# Patient Record
Sex: Female | Born: 1945 | Race: White | Hispanic: No | Smoking: Current every day smoker
Health system: Southern US, Community
[De-identification: ages and names within clinical notes are randomized; demographics above are authoritative.]

## PROBLEM LIST (undated history)

## (undated) ENCOUNTER — Ambulatory Visit: Payer: MEDICARE | Attending: Hematology & Oncology | Primary: Hematology & Oncology

## (undated) ENCOUNTER — Telehealth: Attending: Hematology & Oncology | Primary: Hematology & Oncology

## (undated) ENCOUNTER — Ambulatory Visit: Payer: MEDICARE

## (undated) ENCOUNTER — Ambulatory Visit

## (undated) ENCOUNTER — Ambulatory Visit: Attending: Hematology & Oncology | Primary: Hematology & Oncology

## (undated) ENCOUNTER — Encounter

## (undated) ENCOUNTER — Telehealth

## (undated) ENCOUNTER — Encounter: Attending: Hematology & Oncology | Primary: Hematology & Oncology

## (undated) DIAGNOSIS — C801 Malignant (primary) neoplasm, unspecified: Secondary | ICD-10-CM

---

## 2018-01-10 ENCOUNTER — Emergency Department: Payer: Medicare HMO

## 2018-01-10 ENCOUNTER — Inpatient Hospital Stay: Payer: Medicare HMO

## 2018-01-10 ENCOUNTER — Inpatient Hospital Stay
Admission: EM | Admit: 2018-01-10 | Discharge: 2018-01-14 | DRG: 480 | Disposition: A | Discharge status: Skilled Nursing Facility | Payer: Medicare HMO | Attending: Internal Medicine | Admitting: Internal Medicine

## 2018-01-10 ENCOUNTER — Other Ambulatory Visit: Payer: Self-pay

## 2018-01-10 DIAGNOSIS — M899 Disorder of bone, unspecified: Secondary | ICD-10-CM | POA: Diagnosis not present

## 2018-01-10 DIAGNOSIS — J181 Lobar pneumonia, unspecified organism: Secondary | ICD-10-CM | POA: Diagnosis present

## 2018-01-10 DIAGNOSIS — Z7189 Other specified counseling: Secondary | ICD-10-CM

## 2018-01-10 DIAGNOSIS — J189 Pneumonia, unspecified organism: Secondary | ICD-10-CM | POA: Diagnosis not present

## 2018-01-10 DIAGNOSIS — Z515 Encounter for palliative care: Secondary | ICD-10-CM | POA: Diagnosis not present

## 2018-01-10 DIAGNOSIS — W19XXXA Unspecified fall, initial encounter: Secondary | ICD-10-CM

## 2018-01-10 DIAGNOSIS — R54 Age-related physical debility: Secondary | ICD-10-CM | POA: Diagnosis present

## 2018-01-10 DIAGNOSIS — R55 Syncope and collapse: Secondary | ICD-10-CM

## 2018-01-10 DIAGNOSIS — I10 Essential (primary) hypertension: Secondary | ICD-10-CM | POA: Diagnosis present

## 2018-01-10 DIAGNOSIS — F17210 Nicotine dependence, cigarettes, uncomplicated: Secondary | ICD-10-CM | POA: Diagnosis present

## 2018-01-10 DIAGNOSIS — C50911 Malignant neoplasm of unspecified site of right female breast: Secondary | ICD-10-CM | POA: Diagnosis present

## 2018-01-10 DIAGNOSIS — W1830XA Fall on same level, unspecified, initial encounter: Secondary | ICD-10-CM | POA: Diagnosis present

## 2018-01-10 DIAGNOSIS — Z66 Do not resuscitate: Secondary | ICD-10-CM | POA: Diagnosis present

## 2018-01-10 DIAGNOSIS — F17200 Nicotine dependence, unspecified, uncomplicated: Secondary | ICD-10-CM | POA: Diagnosis present

## 2018-01-10 DIAGNOSIS — E876 Hypokalemia: Secondary | ICD-10-CM | POA: Diagnosis present

## 2018-01-10 DIAGNOSIS — S72012A Unspecified intracapsular fracture of left femur, initial encounter for closed fracture: Principal | ICD-10-CM | POA: Diagnosis present

## 2018-01-10 DIAGNOSIS — S72002A Fracture of unspecified part of neck of left femur, initial encounter for closed fracture: Secondary | ICD-10-CM | POA: Diagnosis present

## 2018-01-10 DIAGNOSIS — R509 Fever, unspecified: Secondary | ICD-10-CM

## 2018-01-10 DIAGNOSIS — Z91011 Allergy to milk products: Secondary | ICD-10-CM | POA: Diagnosis not present

## 2018-01-10 DIAGNOSIS — T148XXA Other injury of unspecified body region, initial encounter: Secondary | ICD-10-CM

## 2018-01-10 DIAGNOSIS — J440 Chronic obstructive pulmonary disease with acute lower respiratory infection: Secondary | ICD-10-CM | POA: Diagnosis present

## 2018-01-10 DIAGNOSIS — Y92239 Unspecified place in hospital as the place of occurrence of the external cause: Secondary | ICD-10-CM | POA: Diagnosis present

## 2018-01-10 DIAGNOSIS — Z681 Body mass index (BMI) 19 or less, adult: Secondary | ICD-10-CM

## 2018-01-10 DIAGNOSIS — Z9011 Acquired absence of right breast and nipple: Secondary | ICD-10-CM | POA: Diagnosis not present

## 2018-01-10 DIAGNOSIS — S72009A Fracture of unspecified part of neck of unspecified femur, initial encounter for closed fracture: Secondary | ICD-10-CM | POA: Diagnosis present

## 2018-01-10 DIAGNOSIS — E43 Unspecified severe protein-calorie malnutrition: Secondary | ICD-10-CM | POA: Diagnosis present

## 2018-01-10 DIAGNOSIS — M810 Age-related osteoporosis without current pathological fracture: Secondary | ICD-10-CM | POA: Diagnosis present

## 2018-01-10 DIAGNOSIS — S72002D Fracture of unspecified part of neck of left femur, subsequent encounter for closed fracture with routine healing: Secondary | ICD-10-CM | POA: Diagnosis not present

## 2018-01-10 DIAGNOSIS — N390 Urinary tract infection, site not specified: Secondary | ICD-10-CM | POA: Diagnosis present

## 2018-01-10 HISTORY — DX: Malignant (primary) neoplasm, unspecified: C80.1

## 2018-01-10 LAB — CBC WITH DIFFERENTIAL/PLATELET
Abs Immature Granulocytes: 0.02 10*3/uL (ref 0.00–0.07)
Basophils Absolute: 0 10*3/uL (ref 0.0–0.1)
Basophils Relative: 1 %
Eosinophils Absolute: 0.1 10*3/uL (ref 0.0–0.5)
Eosinophils Relative: 2 %
HCT: 40.8 % (ref 36.0–46.0)
Hemoglobin: 13.8 g/dL (ref 12.0–15.0)
Immature Granulocytes: 0 %
Lymphocytes Relative: 39 %
Lymphs Abs: 2.9 10*3/uL (ref 0.7–4.0)
MCH: 32.2 pg (ref 26.0–34.0)
MCHC: 33.8 g/dL (ref 30.0–36.0)
MCV: 95.3 fL (ref 80.0–100.0)
Monocytes Absolute: 0.5 10*3/uL (ref 0.1–1.0)
Monocytes Relative: 7 %
Neutro Abs: 3.8 10*3/uL (ref 1.7–7.7)
Neutrophils Relative %: 51 %
Platelets: 308 10*3/uL (ref 150–400)
RBC: 4.28 MIL/uL (ref 3.87–5.11)
RDW: 12.6 % (ref 11.5–15.5)
WBC: 7.4 10*3/uL (ref 4.0–10.5)
nRBC: 0 % (ref 0.0–0.2)

## 2018-01-10 LAB — BASIC METABOLIC PANEL
Anion gap: 11 (ref 5–15)
BUN: 14 mg/dL (ref 8–23)
CO2: 26 mmol/L (ref 22–32)
Calcium: 9 mg/dL (ref 8.9–10.3)
Chloride: 104 mmol/L (ref 98–111)
Creatinine, Ser: 0.62 mg/dL (ref 0.44–1.00)
GFR calc Af Amer: >60 mL/min (ref >60)
GFR calc non Af Amer: >60 mL/min (ref >60)
Glucose, Bld: 131 mg/dL — ABNORMAL HIGH (ref 70–99)
Potassium: 3 mmol/L — ABNORMAL LOW (ref 3.5–5.1)
Sodium: 141 mmol/L (ref 135–145)

## 2018-01-10 LAB — TROPONIN I: Troponin I: <0.03 ng/mL (ref <0.03)

## 2018-01-10 LAB — TSH: TSH: 3.676 u[IU]/mL (ref 0.350–4.500)

## 2018-01-10 LAB — PROTIME-INR
INR: 0.97
Prothrombin Time: 12.8 seconds (ref 11.4–15.2)

## 2018-01-10 MED ORDER — ACETAMINOPHEN 325 MG PO TABS
650.0000 mg | ORAL_TABLET | Freq: Four times a day (QID) | ORAL | Status: DC | PRN
  Administered 2018-01-12 – 2018-01-14 (×4): 650 mg via ORAL
  Filled 2018-01-10 (×4): qty 2

## 2018-01-10 MED ORDER — NICOTINE 14 MG/24HR TD PT24
14.0000 mg | MEDICATED_PATCH | Freq: Every day | TRANSDERMAL | Status: DC
  Administered 2018-01-10 – 2018-01-14 (×5): 14 mg via TRANSDERMAL
  Filled 2018-01-10 (×5): qty 1

## 2018-01-10 MED ORDER — HYDROCODONE-ACETAMINOPHEN 5-325 MG PO TABS
1.0000 | ORAL_TABLET | ORAL | Status: DC | PRN
  Administered 2018-01-11 (×2): 2 via ORAL
  Administered 2018-01-13 (×2): 1 via ORAL
  Filled 2018-01-10 (×2): qty 1
  Filled 2018-01-10: qty 2
  Filled 2018-01-10: qty 1
  Filled 2018-01-10: qty 2

## 2018-01-10 MED ORDER — CEFAZOLIN SODIUM-DEXTROSE 1-4 GM/50ML-% IV SOLN
1.0000 g | INTRAVENOUS | Status: AC
  Filled 2018-01-10: qty 50

## 2018-01-10 MED ORDER — ACETAMINOPHEN 650 MG RE SUPP: 650.0000 mg | Freq: Four times a day (QID) | RECTAL | Status: DC | PRN

## 2018-01-10 MED ORDER — ONDANSETRON HCL 4 MG PO TABS: 4.0000 mg | ORAL_TABLET | Freq: Four times a day (QID) | ORAL | Status: DC | PRN

## 2018-01-10 MED ORDER — NICOTINE 14 MG/24HR TD PT24: 14.0000 mg | MEDICATED_PATCH | Freq: Every day | TRANSDERMAL | Status: DC

## 2018-01-10 MED ORDER — CLINDAMYCIN PHOSPHATE 600 MG/50ML IV SOLN
600.0000 mg | INTRAVENOUS | Status: DC
  Filled 2018-01-10: qty 50

## 2018-01-10 MED ORDER — HEPARIN SODIUM (PORCINE) 5000 UNIT/ML IJ SOLN
5000.0000 [IU] | Freq: Three times a day (TID) | INTRAMUSCULAR | Status: DC
  Administered 2018-01-10 – 2018-01-12 (×4): 5000 [IU] via SUBCUTANEOUS
  Filled 2018-01-10 (×4): qty 1

## 2018-01-10 MED ORDER — MORPHINE SULFATE (PF) 4 MG/ML IV SOLN
4.0000 mg | Freq: Once | INTRAVENOUS | Status: AC
  Administered 2018-01-10: 4 mg via INTRAVENOUS
  Filled 2018-01-10: qty 1

## 2018-01-10 MED ORDER — DOCUSATE SODIUM 100 MG PO CAPS
100.0000 mg | ORAL_CAPSULE | Freq: Two times a day (BID) | ORAL | Status: DC
  Filled 2018-01-10 (×4): qty 1

## 2018-01-10 MED ORDER — HYDRALAZINE HCL 20 MG/ML IJ SOLN: 10.0000 mg | INTRAMUSCULAR | Status: DC | PRN

## 2018-01-10 MED ORDER — POTASSIUM CHLORIDE CRYS ER 20 MEQ PO TBCR
20.0000 meq | EXTENDED_RELEASE_TABLET | Freq: Once | ORAL | Status: AC
  Administered 2018-01-10: 20 meq via ORAL
  Filled 2018-01-10: qty 1

## 2018-01-10 MED ORDER — HYDROCODONE-ACETAMINOPHEN 5-325 MG PO TABS
1.0000 | ORAL_TABLET | ORAL | Status: DC | PRN
  Administered 2018-01-10: 1 via ORAL
  Filled 2018-01-10: qty 1

## 2018-01-10 MED ORDER — SODIUM CHLORIDE 0.9 % IV SOLN
INTRAVENOUS | Status: DC
  Administered 2018-01-10 – 2018-01-12 (×3): via INTRAVENOUS

## 2018-01-10 MED ORDER — MORPHINE SULFATE (PF) 2 MG/ML IV SOLN: 1.0000 mg | INTRAVENOUS | Status: DC | PRN

## 2018-01-10 MED ORDER — POLYETHYLENE GLYCOL 3350 17 G PO PACK
17.0000 g | PACK | Freq: Every day | ORAL | Status: DC | PRN
  Administered 2018-01-13: 17 g via ORAL
  Filled 2018-01-10: qty 1

## 2018-01-10 MED ORDER — ONDANSETRON HCL 4 MG/2ML IJ SOLN: 4.0000 mg | Freq: Four times a day (QID) | INTRAMUSCULAR | Status: DC | PRN

## 2018-01-10 MED ORDER — METOPROLOL TARTRATE 25 MG PO TABS
25.0000 mg | ORAL_TABLET | Freq: Two times a day (BID) | ORAL | Status: DC
  Administered 2018-01-11 – 2018-01-14 (×7): 25 mg via ORAL
  Filled 2018-01-10 (×7): qty 1

## 2018-01-10 MED ORDER — MORPHINE SULFATE (PF) 2 MG/ML IV SOLN
2.0000 mg | INTRAVENOUS | Status: DC | PRN
  Administered 2018-01-10: 2 mg via INTRAVENOUS
  Filled 2018-01-10: qty 1

## 2018-01-10 MED ORDER — POTASSIUM CHLORIDE 20 MEQ PO PACK
50.0000 meq | PACK | Freq: Once | ORAL | Status: AC
  Administered 2018-01-10: 50 meq via ORAL
  Filled 2018-01-10: qty 3

## 2018-01-10 MED ORDER — ONDANSETRON HCL 4 MG/2ML IJ SOLN
4.0000 mg | Freq: Once | INTRAMUSCULAR | Status: AC
  Administered 2018-01-10: 4 mg via INTRAVENOUS
  Filled 2018-01-10: qty 2

## 2018-01-10 MED ORDER — ADULT MULTIVITAMIN W/MINERALS CH
1.0000 | ORAL_TABLET | Freq: Every day | ORAL | Status: DC
  Administered 2018-01-10 – 2018-01-13 (×2): 1 via ORAL
  Filled 2018-01-10 (×4): qty 1

## 2018-01-10 MED ORDER — BISACODYL 5 MG PO TBEC
5.0000 mg | DELAYED_RELEASE_TABLET | Freq: Every day | ORAL | Status: DC | PRN
  Administered 2018-01-13: 5 mg via ORAL
  Filled 2018-01-10: qty 1

## 2018-01-10 MED ORDER — CHLORHEXIDINE GLUCONATE 4 % EX LIQD: 1.0000 "application " | Freq: Once | CUTANEOUS | Status: DC

## 2018-01-10 NOTE — ED Notes (Signed)
Patient transported to X-ray 

## 2018-01-10 NOTE — H&P (Signed)
[facility] NAME: Janet [name] Janet [name] [name]    MR#:  [id number]  DATE OF BIRTH:  Jul 09, 1945  DATE OF ADMISSION:  01/10/2018  PRIMARY CARE PHYSICIAN: [facility], [place]   REQUESTING/REFERRING PHYSICIAN:   CHIEF COMPLAINT:   Chief Complaint  Janet [name] [name] presents with  . Fall    HISTORY OF PRESENT ILLNESS: Janet [name] Janet [name] [name]  is a 72 y.o. female with a known history of untreated breast cancer and tobacco smoking presents to Janet [name] emergency room status post dizzy event resulting in near passing out, falling to Janet [name] floor, developed left hip pain, in Janet [name] emergency room Janet [name] [name] was found to have a femoral neck fracture, potassium 3, blood pressure elevated, Janet [name] [name] resting comfortably in bed wishes to be made DNR going forward, Janet [name] [name] now be admitted for acute left femoral neck fracture and untreated breast cancer.   PAST MEDICAL HISTORY:   Past Medical History:  Diagnosis Date  . Cancer ([place])     PAST SURGICAL HISTORY: None  SOCIAL HISTORY:  Social History   Tobacco Use  . Smoking status: Current Every Day Smoker    Packs/day: 0.50    Types: Cigarettes  . Smokeless tobacco: Never Used  Substance Use Topics  . Alcohol use: Not Currently    FAMILY HISTORY:  HTN  DRUG ALLERGIES:  Allergies  Allergen Reactions  . Lactose Intolerance (Gi) Other (See Comments)    REVIEW OF SYSTEMS:   CONSTITUTIONAL: No fever, fatigue or weakness.  EYES: No blurred or double vision.  EARS, NOSE, AND THROAT: No tinnitus or ear pain.  RESPIRATORY: No cough, shortness of breath, wheezing or hemoptysis.  CARDIOVASCULAR: No chest pain, orthopnea, edema.  GASTROINTESTINAL: No nausea, vomiting, diarrhea or abdominal pain.  GENITOURINARY: No dysuria, hematuria.  ENDOCRINE: No polyuria, nocturia,  HEMATOLOGY: No anemia, easy bruising or bleeding SKIN: No rash or lesion. MUSCULOSKELETAL: Left hip pain    NEUROLOGIC: No tingling, numbness, weakness.   PSYCHIATRY: No anxiety or depression.   MEDICATIONS AT HOME:  Prior to Admission medications   Not on File      PHYSICAL EXAMINATION:   VITAL SIGNS: Blood pressure (!) 173/70, pulse 81, temperature 98.1 F (36.7 C), temperature source Oral, resp. rate 18, height 5\' 1"  (1.549 m), weight 45.4 kg, SpO2 93 %.  GENERAL:  72 y.o.-year-old Janet [name] [name] lying in Janet [name] bed with no acute distress.  Cachectic appearing EYES: Pupils equal, round, reactive to light and accommodation. No scleral icterus. Extraocular muscles intact.  HEENT: Head atraumatic, normocephalic. Oropharynx and nasopharynx clear.  NECK:  Supple, no jugular venous distention. No thyroid enlargement, no tenderness.  LUNGS: Normal breath sounds bilaterally, no wheezing, rales,rhonchi or crepitation. No use of accessory muscles of respiration.  CARDIOVASCULAR: S1, S2 normal. No murmurs, rubs, or gallops.  ABDOMEN: Soft, nontender, nondistended. Bowel sounds present. No organomegaly or mass.  EXTREMITIES: No pedal edema, cyanosis, or clubbing.  Decreased range of motion left lower extremity, left hip tenderness NEUROLOGIC: Cranial nerves II through XII are intact. MAES. Gait not checked.  PSYCHIATRIC: Janet [name] Janet [name] [name] is alert and oriented x 3.  SKIN: No obvious rash, lesion, or ulcer.   LABORATORY PANEL:   CBC Recent Labs  Lab 01/10/18 1551  WBC 7.4  HGB 13.8  HCT 40.8  PLT 308  MCV 95.3  MCH 32.2  MCHC 33.8  RDW 12.6  LYMPHSABS 2.9  MONOABS 0.5  EOSABS 0.1  BASOSABS 0.0   ------------------------------------------------------------------------------------------------------------------  Chemistries  Recent Labs  Lab 01/10/18 1551  NA 141  K 3.0*  CL 104  CO2 26  GLUCOSE 131*  BUN 14  CREATININE 0.62  CALCIUM 9.0   ------------------------------------------------------------------------------------------------------------------ estimated creatinine clearance is 44.9 mL/min (by C-G formula based on SCr of 0.62  mg/dL). ------------------------------------------------------------------------------------------------------------------ No results for input(s): TSH, T4TOTAL, T3FREE, THYROIDAB in Janet [name] last 72 hours.  Invalid input(s): FREET3   Coagulation profile Recent Labs  Lab 01/10/18 1551  INR 0.97   ------------------------------------------------------------------------------------------------------------------- No results for input(s): DDIMER in Janet [name] last 72 hours. -------------------------------------------------------------------------------------------------------------------  Cardiac Enzymes Recent Labs  Lab 01/10/18 1551  TROPONINI <0.03   ------------------------------------------------------------------------------------------------------------------ Invalid input(s): POCBNP  ---------------------------------------------------------------------------------------------------------------  Urinalysis No results found for: COLORURINE, APPEARANCEUR, LABSPEC, PHURINE, GLUCOSEU, HGBUR, BILIRUBINUR, KETONESUR, PROTEINUR, UROBILINOGEN, NITRITE, LEUKOCYTESUR   RADIOLOGY: Dg Chest Portable 1 View  Result Date: 01/10/2018 CLINICAL DATA:  Weakness.  Syncope. EXAM: PORTABLE CHEST 1 VIEW COMPARISON:  None. FINDINGS: Janet [name] cardiomediastinal silhouette is within normal limits. Janet [name] lungs are hyperinflated and clear. No pleural effusion or pneumothorax is identified. No acute osseous abnormality is seen. IMPRESSION: No active disease. Electronically Signed   By: Logan Bores M.D.   On: 01/10/2018 16:40   Dg Hip Unilat With Pelvis 2-3 Views Left  Result Date: 01/10/2018 CLINICAL DATA:  Fall today while walking.  Left hip pain. EXAM: DG HIP (WITH OR WITHOUT PELVIS) 2-3V LEFT COMPARISON:  None. FINDINGS: Mild diffuse osteopenia. Minimally displaced subcapital fracture of Janet [name] left femoral neck. Mild degenerative change of Janet [name] right hip and spine. IMPRESSION: Minimally displaced subcapital fracture of  Janet [name] left femoral neck. Electronically Signed   By: Marin Olp M.D.   On: 01/10/2018 16:41    EKG: Orders placed or performed during Janet [name] hospital encounter of 01/10/18  . ED EKG  . ED EKG    IMPRESSION AND PLAN: *Acute left femoral neck fracture status post mechanical fall *Acute near syncope *Chronic untreated breast cancer *Chronic tobacco smoking abuse/dependency *Acute hypokalemia  Admit to regular nursing for bed, orthopedic surgery to see, adult pain protocol, n.p.o. after midnight, replete potassium, check magnesium level/BMP in Janet [name] morning, tobacco cessation counseling ordered, nicotine patch daily, consult palliative care given untreated chronic breast cancer, and continue close medical monitoring  Long-term prognosis is poor-palliative care consulted  All Janet [name] records are reviewed and case discussed with ED provider. Management plans discussed with Janet [name] Janet [name] [name], family and they are in agreement.  CODE STATUS:dnr    TOTAL TIME TAKING CARE OF THIS Janet [name] [name]: 45 minutes.    Avel Peace Teigen Parslow M.D on 01/10/2018   Between 7am to 6pm - Pager - [phone]  After 6pm go to [email or web address] - password [email or web address] [facility]  Office  [phone]  CC: Primary care physician; [employer], [employer]   Note: This dictation was prepared with Diplomatic Services operational officer dictation along with smaller Company secretary. Any transcriptional errors that result from this process are unintentional.

## 2018-01-10 NOTE — ED Notes (Signed)
Raquel RN, aware of bed assigned  

## 2018-01-10 NOTE — Progress Notes (Signed)
Family Meeting Note  Advance Directive:yes  Today a meeting took place with the Patient.  Patient is able to participate   The following clinical team members were present during this meeting:MD  The following were discussed:Patient's diagnosis: Untreated breast cancer, Patient's progosis: Unable to determine and Goals for treatment: DNR  Additional follow-up to be provided: prn  Time spent during discussion:20 minutes  Gorden Harms, MD

## 2018-01-10 NOTE — ED Notes (Signed)
Report called to [facility] RN pt admitted to 143 via stretcher.

## 2018-01-10 NOTE — ED Notes (Signed)
Attempted to contact pt's daugher [name] [place]: [phone], [phone]) with no answer. Will try again later.

## 2018-01-10 NOTE — Anesthesia Preprocedure Evaluation (Addendum)
Anesthesia Evaluation  Patient identified by MRN, date of birth, ID band Patient awake    Reviewed: Allergy & Precautions, H&P , NPO status , Patient's Chart, lab work & pertinent test results, reviewed documented beta blocker date and time   History of Anesthesia Complications Negative for: history of anesthetic complications  Airway Mallampati: II  TM Distance: >3 FB Neck ROM: full    Dental  (+) Edentulous Upper, Edentulous Lower, Dental Advidsory Given   Pulmonary neg shortness of breath, neg sleep apnea, neg COPD, neg recent URI, Current Smoker,           Cardiovascular Exercise Tolerance: Good negative cardio ROS       Neuro/Psych Seizures -, Well Controlled,  CVA, Residual Symptoms negative psych ROS   GI/Hepatic negative GI ROS, Neg liver ROS,   Endo/Other  negative endocrine ROS  Renal/GU negative Renal ROS  negative genitourinary   Musculoskeletal   Abdominal   Peds  Hematology negative hematology ROS (+)   Anesthesia Other Findings Past Medical History: No date: Cancer ([place])   Reproductive/Obstetrics negative OB ROS                            Anesthesia Physical Anesthesia Plan  ASA: III  Anesthesia Plan: Spinal   Post-op Pain Management:    Induction:   PONV Risk Score and Plan:   Airway Management Planned: Simple Face Mask and Natural Airway  Additional Equipment:   Intra-op Plan:   Post-operative Plan:   Informed Consent: I have reviewed the patients History and Physical, chart, labs and discussed the procedure including the risks, benefits and alternatives for the proposed anesthesia with the patient or authorized representative who has indicated his/her understanding and acceptance.   Dental Advisory Given  Plan Discussed with: Anesthesiologist, CRNA and Surgeon  Anesthesia Plan Comments: (We would like serum potassium to be equal or greater than 3.0 to  proceed with a non emergent case )       Anesthesia Quick Evaluation

## 2018-01-10 NOTE — Progress Notes (Signed)
Patient's daughter [name] notified of patient's admission per patient request.

## 2018-01-10 NOTE — ED Triage Notes (Addendum)
Pt arrived via ems from store due to a fall. Pt walked from her home to the local store where she fell. Pt does not recall what happened. Pt A&O but c/o of left hip pain whenever it is moved. Pt was diagnosed with CA in 2016 but has not recieved any treatment, noted wound to center chest. Pt NAD, respirations even and non labored.

## 2018-01-10 NOTE — ED Provider Notes (Signed)
[facility] Emergency Department Provider Note ____________________________________________   First MD Initiated Contact with Patient 01/10/18 1545     (approximate)  I have reviewed the triage vital signs and the nursing notes.   HISTORY  Chief Complaint Fall    HPI Janet [name] is a 72 y.o. female with PMH as noted below who presents with left hip injury after a fall.  The patient states that she suddenly became lightheaded and dizzy, causing her to fall.  The onset was acute.  She did not hit her head and denies any other injuries.  Patient states that she was diagnosed with breast cancer several years ago but never sought any treatment.  She states that she has been feeling somewhat generally weak recently.  Past Medical History:  Diagnosis Date  . Cancer ([place])     There are no active problems to display for this patient.    Prior to Admission medications   Not on File    Allergies Lactose intolerance (gi)  No family history on file.  Social History Social History   Tobacco Use  . Smoking status: Current Every Day Smoker    Packs/day: 0.50    Types: Cigarettes  . Smokeless tobacco: Never Used  Substance Use Topics  . Alcohol use: Not Currently  . Drug use: Not on file    Review of Systems  Constitutional: No fever.  Positive for weakness. Eyes: No redness. ENT: No neck pain. Cardiovascular: Denies chest pain. Respiratory: Denies shortness of breath. Gastrointestinal: No vomiting or diarrhea.  Genitourinary: Negative for dysuria.  Musculoskeletal: Negative for back pain.  Positive for left hip pain. Skin: Negative for rash. Neurological: Negative for headaches, focal weakness or numbness.   ____________________________________________   PHYSICAL EXAM:  VITAL SIGNS: ED Triage Vitals [01/10/18 1547]  Enc Vitals Group     BP (!) 158/75     Pulse Rate 82     Resp 18     Temp 98.1 F (36.7 C)     Temp Source Oral       SpO2 95 %     Weight 100 lb (45.4 kg)     Height 5\' 1"  (1.549 m)     Head Circumference      Peak Flow      Pain Score 0     Pain Loc      Pain Edu?      Excl. in [place]?     Constitutional: Alert and oriented.  Somewhat frail appearing but in no acute distress. Eyes: Conjunctivae are normal.  Head: Atraumatic. Nose: No congestion/rhinnorhea. Mouth/Throat: Mucous membranes are slightly dry.   Neck: Normal range of motion.  Cardiovascular: Normal rate, regular rhythm. Grossly normal heart sounds.  Good peripheral circulation. Respiratory: Normal respiratory effort.  No retractions. Lungs CTAB. Gastrointestinal: Soft and nontender. No distention.  Genitourinary: No CVA tenderness. Musculoskeletal: No lower extremity edema.  Extremities warm and well perfused.  Tenderness to palpation of left hip and pain on any range of motion.  No obvious deformity.  2+ DP pulses bilaterally.  Full range of motion at all of the large joints.  No midline spinal tenderness. Neurologic:  Normal speech and language. No gross focal neurologic deficits are appreciated.  Skin:  Skin is warm and dry. No rash noted.  Right anterior chest wall with chronic appearing ulcerated wound that is currently dry and not draining anything. Psychiatric: Mood and affect are normal. Speech and behavior are normal.  ____________________________________________  LABS (all labs ordered are listed, but only abnormal results are displayed)  Labs Reviewed  BASIC METABOLIC PANEL - Abnormal; Notable for the following components:      Result Value   Potassium 3.0 (*)    Glucose, Bld 131 (*)    All other components within normal limits  CBC WITH DIFFERENTIAL/PLATELET  TROPONIN I  PROTIME-INR  URINALYSIS, COMPLETE (UACMP) WITH MICROSCOPIC   ____________________________________________  EKG  ED ECG REPORT I, Arta Silence, the attending physician, personally viewed and interpreted this ECG.  Date: 01/10/2018 EKG  Time:  Rate:  Rhythm: normal sinus rhythm QRS Axis: normal Intervals: normal ST/T Wave abnormalities: normal Narrative Interpretation: no evidence of acute ischemia  ____________________________________________  RADIOLOGY  L hip XR: Subcapital femoral neck fracture CXR: No focal infiltrate or other acute abnormality  ____________________________________________   PROCEDURES  Procedure(s) performed: No  Procedures  Critical Care performed: No ____________________________________________   INITIAL IMPRESSION / ASSESSMENT AND PLAN / ED COURSE  Pertinent labs & imaging results that were available during my care of the patient were reviewed by me and considered in my medical decision making (see chart for details).  72 year old female with apparent history of breast cancer for which she has never sought treatment presents with an episode of near syncope resulting in a fall and left hip injury.  The patient reports some generalized weakness recently.  She denies head injury or other acute injuries.  I attempted to review past medical records in Epic, but none are available.  On exam, the patient is somewhat frail appearing but in no distress.  Her vital signs are normal except for hypertension.  She has tenderness and pain to the left hip but no significant deformity.  All of her extremities are neuro/vascular intact.  Neuro exam is normal.  She has a chronic appearing wound to her right chest wall which is dry and not currently open, which is concerning for possible cancer.  Overall I am concerned for acute hip fracture.  The differential for her near syncope and generalized weakness is broad.  Will obtain lab work-up, x-rays, and reassess.  The wound to the chest wall may be a fungating breast mass although this may be some unrelated scarring or other cause.  She will require additional work-up as an inpatient.  ----------------------------------------- 6:22 PM on  01/10/2018 -----------------------------------------  Chest x-ray shows no significant acute findings.  Hip x-ray shows subcapital femoral neck fracture.  I consulted Dr. Sabra Heck (temporarily covering for Dr. Harlow Mares) from orthopedics who advises that the patient will need surgery for pinning, likely tomorrow.  The patient's lab work-up is unremarkable.  She is not on any anticoagulation.  She is stable for admission at this time.  I signed her out to the hospitalist Dr. Jerelyn Charles. ____________________________________________   FINAL CLINICAL IMPRESSION(S) / ED DIAGNOSES  Final diagnoses:  Closed fracture of neck of left femur, initial encounter ([place])  Near syncope      NEW MEDICATIONS STARTED DURING THIS VISIT:  New Prescriptions   No medications on file     Note:  This document was prepared using Dragon voice recognition software and may include unintentional dictation errors.    Arta Silence, MD 01/10/18 [phone]

## 2018-01-11 ENCOUNTER — Inpatient Hospital Stay: Payer: Medicare HMO

## 2018-01-11 ENCOUNTER — Encounter
Admission: EM | Disposition: A | Discharge status: Skilled Nursing Facility | Payer: Self-pay | Source: Home / Self Care | Attending: Internal Medicine

## 2018-01-11 ENCOUNTER — Inpatient Hospital Stay: Payer: Medicare HMO | Admitting: Anesthesiology

## 2018-01-11 ENCOUNTER — Encounter: Payer: Self-pay | Admitting: Anesthesiology

## 2018-01-11 HISTORY — PX: HIP PINNING,CANNULATED: SHX1758

## 2018-01-11 LAB — BASIC METABOLIC PANEL
Anion gap: 7 (ref 5–15)
BUN: 11 mg/dL (ref 8–23)
CO2: 27 mmol/L (ref 22–32)
Calcium: 8.9 mg/dL (ref 8.9–10.3)
Chloride: 107 mmol/L (ref 98–111)
Creatinine, Ser: 0.52 mg/dL (ref 0.44–1.00)
GFR calc Af Amer: >60 mL/min (ref >60)
GFR calc non Af Amer: >60 mL/min (ref >60)
Glucose, Bld: 147 mg/dL — ABNORMAL HIGH (ref 70–99)
Potassium: 4 mmol/L (ref 3.5–5.1)
Sodium: 141 mmol/L (ref 135–145)

## 2018-01-11 LAB — SURGICAL PCR SCREEN
MRSA, PCR: NEGATIVE
Staphylococcus aureus: NEGATIVE

## 2018-01-11 LAB — MAGNESIUM: Magnesium: 2 mg/dL (ref 1.7–2.4)

## 2018-01-11 SURGERY — FIXATION, FEMUR, NECK, PERCUTANEOUS, USING SCREW
Anesthesia: Spinal | Laterality: Left

## 2018-01-11 MED ORDER — ONDANSETRON HCL 4 MG/2ML IJ SOLN: 4.0000 mg | Freq: Once | INTRAMUSCULAR | Status: DC | PRN

## 2018-01-11 MED ORDER — METOCLOPRAMIDE HCL 10 MG PO TABS: 5.0000 mg | ORAL_TABLET | Freq: Three times a day (TID) | ORAL | Status: DC | PRN

## 2018-01-11 MED ORDER — KETAMINE HCL 50 MG/ML IJ SOLN
INTRAMUSCULAR | Status: AC
  Filled 2018-01-11: qty 10

## 2018-01-11 MED ORDER — SODIUM CHLORIDE 0.9 % IV SOLN
INTRAVENOUS | Status: DC | PRN
  Administered 2018-01-11: 50 ug/min via INTRAVENOUS

## 2018-01-11 MED ORDER — METOCLOPRAMIDE HCL 5 MG/ML IJ SOLN: 5.0000 mg | Freq: Three times a day (TID) | INTRAMUSCULAR | Status: DC | PRN

## 2018-01-11 MED ORDER — CEFAZOLIN SODIUM-DEXTROSE 2-4 GM/100ML-% IV SOLN
2.0000 g | INTRAVENOUS | Status: AC
  Administered 2018-01-11: 1 g via INTRAVENOUS
  Filled 2018-01-11: qty 100

## 2018-01-11 MED ORDER — EPHEDRINE SULFATE 50 MG/ML IJ SOLN
INTRAMUSCULAR | Status: DC | PRN
  Administered 2018-01-11: 5 mg via INTRAVENOUS
  Administered 2018-01-11: 10 mg via INTRAVENOUS

## 2018-01-11 MED ORDER — BUPIVACAINE HCL (PF) 0.5 % IJ SOLN
INTRAMUSCULAR | Status: DC | PRN
  Administered 2018-01-11: 2.5 mL

## 2018-01-11 MED ORDER — CEFAZOLIN SODIUM-DEXTROSE 1-4 GM/50ML-% IV SOLN
1.0000 g | Freq: Four times a day (QID) | INTRAVENOUS | Status: AC
  Administered 2018-01-11 – 2018-01-12 (×3): 1 g via INTRAVENOUS
  Filled 2018-01-11 (×3): qty 50

## 2018-01-11 MED ORDER — ONDANSETRON HCL 4 MG/2ML IJ SOLN: 4.0000 mg | Freq: Four times a day (QID) | INTRAMUSCULAR | Status: DC | PRN

## 2018-01-11 MED ORDER — MIDAZOLAM HCL 2 MG/2ML IJ SOLN
INTRAMUSCULAR | Status: AC
  Filled 2018-01-11: qty 2

## 2018-01-11 MED ORDER — PROPOFOL 500 MG/50ML IV EMUL
INTRAVENOUS | Status: AC
  Filled 2018-01-11: qty 50

## 2018-01-11 MED ORDER — KETAMINE HCL 50 MG/ML IJ SOLN
INTRAMUSCULAR | Status: DC | PRN
  Administered 2018-01-11: 25 mg via INTRAMUSCULAR

## 2018-01-11 MED ORDER — ONDANSETRON HCL 4 MG PO TABS: 4.0000 mg | ORAL_TABLET | Freq: Four times a day (QID) | ORAL | Status: DC | PRN

## 2018-01-11 MED ORDER — LACTATED RINGERS IV SOLN
INTRAVENOUS | Status: DC | PRN
  Administered 2018-01-11: 13:00:00 via INTRAVENOUS

## 2018-01-11 MED ORDER — FENTANYL CITRATE (PF) 100 MCG/2ML IJ SOLN: 25.0000 ug | INTRAMUSCULAR | Status: DC | PRN

## 2018-01-11 MED ORDER — MIDAZOLAM HCL 5 MG/5ML IJ SOLN
INTRAMUSCULAR | Status: DC | PRN
  Administered 2018-01-11: 2 mg via INTRAVENOUS

## 2018-01-11 MED ORDER — SODIUM CHLORIDE 0.9 % IR SOLN
Status: DC | PRN
  Administered 2018-01-11: 300 mL

## 2018-01-11 MED ORDER — DOCUSATE SODIUM 100 MG PO CAPS
100.0000 mg | ORAL_CAPSULE | Freq: Two times a day (BID) | ORAL | Status: DC
  Administered 2018-01-11 – 2018-01-14 (×6): 100 mg via ORAL
  Filled 2018-01-11 (×5): qty 1

## 2018-01-11 SURGICAL SUPPLY — 34 items
BIT DRILL CANN LRG QC 5X300 (BIT) ×2 IMPLANT
BLADE SURG SZ10 CARB STEEL (BLADE) ×2 IMPLANT
BNDG COHESIVE 6X5 TAN STRL LF (GAUZE/BANDAGES/DRESSINGS) ×4 IMPLANT
BRUSH SCRUB EZ  4% CHG (MISCELLANEOUS) ×2
BRUSH SCRUB EZ 4% CHG (MISCELLANEOUS) ×2 IMPLANT
CANISTER SUCT 1200ML W/VALVE (MISCELLANEOUS) ×2 IMPLANT
CHLORAPREP W/TINT 26ML (MISCELLANEOUS) ×2 IMPLANT
COVER WAND RF STERILE (DRAPES) ×2 IMPLANT
DRSG OPSITE POSTOP 4X8 (GAUZE/BANDAGES/DRESSINGS) ×2 IMPLANT
ELECT REM PT RETURN 9FT ADLT (ELECTROSURGICAL) ×2
ELECTRODE REM PT RTRN 9FT ADLT (ELECTROSURGICAL) ×1 IMPLANT
GAUZE PETRO XEROFOAM 1X8 (MISCELLANEOUS) ×2 IMPLANT
GAUZE SPONGE 4X4 12PLY STRL (GAUZE/BANDAGES/DRESSINGS) ×2 IMPLANT
GLOVE INDICATOR 8.0 STRL GRN (GLOVE) ×2 IMPLANT
GLOVE SURG ORTHO 8.0 STRL STRW (GLOVE) ×4 IMPLANT
GOWN STRL REUS W/ TWL LRG LVL3 (GOWN DISPOSABLE) ×1 IMPLANT
GOWN STRL REUS W/ TWL XL LVL3 (GOWN DISPOSABLE) ×1 IMPLANT
GOWN STRL REUS W/TWL LRG LVL3 (GOWN DISPOSABLE) ×1
GOWN STRL REUS W/TWL XL LVL3 (GOWN DISPOSABLE) ×1
GUIDEWIRE THREADED 2.8 (WIRE) ×2 IMPLANT
HOLDER FOLEY CATH W/STRAP (MISCELLANEOUS) IMPLANT
KIT TURNOVER CYSTO (KITS) ×2 IMPLANT
MAT ABSORB  FLUID 56X50 GRAY (MISCELLANEOUS) ×1
MAT ABSORB FLUID 56X50 GRAY (MISCELLANEOUS) ×1 IMPLANT
NS IRRIG 1000ML POUR BTL (IV SOLUTION) ×2 IMPLANT
PACK HIP COMPR (MISCELLANEOUS) ×2 IMPLANT
SCREW CANN 32MM 6.5X70MM (Screw) ×2 IMPLANT
SCREW CANN 32MM 6.5X75MM (Screw) ×2 IMPLANT
SCREW CANN F/THREAD 6.5X75 (Screw) ×2 IMPLANT
STAPLER SKIN PROX 35W (STAPLE) ×2 IMPLANT
SUT VIC AB 2-0 CT2 27 (SUTURE) ×2 IMPLANT
TRAY FOLEY MTR SLVR 16FR STAT (SET/KITS/TRAYS/PACK) IMPLANT
WASHER FOR 5.0 SCREWS (Washer) ×2 IMPLANT
WATER STERILE IRR 1000ML POUR (IV SOLUTION) ×2 IMPLANT

## 2018-01-11 NOTE — Transfer of Care (Signed)
Immediate Anesthesia Transfer of Care Note  Patient: Parissa Chiao  Procedure(s) Performed: CANNULATED HIP PINNING (Left )  Patient Location: PACU  Anesthesia Type:Spinal  Level of Consciousness: awake, alert  and oriented  Airway & Oxygen Therapy: Patient Spontanous Breathing  Post-op Assessment: Report given to RN and Post -op Vital signs reviewed and stable  Post vital signs: Reviewed and stable  Last Vitals:  Vitals Value Taken Time  BP    Temp    Pulse 78 01/11/2018  1:11 PM  Resp    SpO2 99 % 01/11/2018  1:11 PM  Vitals shown include unvalidated device data.  Last Pain:  Vitals:   01/11/18 0809  TempSrc: Oral  PainSc:       Patients Stated Pain Goal: 2 (90/68/93 4068)  Complications: No apparent anesthesia complications

## 2018-01-11 NOTE — Progress Notes (Addendum)
[facility] [place] NAME: Janet [name]    MR#:  [id number]  DATE OF BIRTH:  1945-08-29  SUBJECTIVE:  CHIEF COMPLAINT:   Chief Complaint  Patient presents with  . Fall   -Fall and left subcapital femoral fracture.  Complaining of pain.  For surgery today  REVIEW OF SYSTEMS:  Review of Systems  Constitutional: Negative for chills, fever and malaise/fatigue.  HENT: Negative for congestion, ear discharge, hearing loss and nosebleeds.   Eyes: Negative for blurred vision and double vision.  Respiratory: Negative for cough, shortness of breath and wheezing.   Cardiovascular: Negative for chest pain and palpitations.  Gastrointestinal: Negative for abdominal pain, constipation, diarrhea, nausea and vomiting.  Genitourinary: Negative for dysuria and urgency.  Musculoskeletal: Positive for falls, joint pain and myalgias.  Neurological: Negative for dizziness, focal weakness, seizures, weakness and headaches.  Psychiatric/Behavioral: Negative for depression.    DRUG ALLERGIES:   Allergies  Allergen Reactions  . Lactose Intolerance (Gi) Other (See Comments)    VITALS:  Blood pressure (!) 168/68, pulse (!) 112, temperature (!) 100.8 F (38.2 C), temperature source Oral, resp. rate 19, height 5\' 1"  (1.549 m), weight 43 kg, SpO2 91 %.  PHYSICAL EXAMINATION:  Physical Exam  GENERAL:  72 y.o.-year-old thin built and ill nourished patient lying in the bed with no acute distress.  EYES: Pupils equal, round, reactive to light and accommodation. No scleral icterus. Extraocular muscles intact.  HEENT: Head atraumatic, normocephalic. Oropharynx and nasopharynx clear.  NECK:  Supple, no jugular venous distention. No thyroid enlargement, no tenderness.  Status post right mastectomy LUNGS: Normal breath sounds bilaterally, no wheezing, rales,rhonchi or crepitation. No use of accessory muscles of respiration.  Decreased bibasilar breath sounds  noted.  CARDIOVASCULAR: S1, S2 normal. No  rubs, or gallops.  2/6 systolic murmur is present ABDOMEN: Soft, nontender, nondistended. Bowel sounds present. No organomegaly or mass.  EXTREMITIES: No pedal edema, cyanosis, or clubbing.  NEUROLOGIC: Cranial nerves II through XII are intact. Muscle strength 5/5 in all extremities except the left leg which is abducted and externally rotated. Sensation intact. Gait not checked.  PSYCHIATRIC: The patient is alert and oriented x 3.  SKIN: No obvious rash, lesion, or ulcer.    LABORATORY PANEL:   CBC Recent Labs  Lab 01/10/18 1551  WBC 7.4  HGB 13.8  HCT 40.8  PLT 308   ------------------------------------------------------------------------------------------------------------------  Chemistries  Recent Labs  Lab 01/11/18 0332  NA 141  K 4.0  CL 107  CO2 27  GLUCOSE 147*  BUN 11  CREATININE 0.52  CALCIUM 8.9  MG 2.0   ------------------------------------------------------------------------------------------------------------------  Cardiac Enzymes Recent Labs  Lab 01/10/18 1551  TROPONINI <0.03   ------------------------------------------------------------------------------------------------------------------  RADIOLOGY:  Ct Hip Left Wo Contrast  Result Date: 01/10/2018 CLINICAL DATA:  Untreated breast cancer with acute left femoral neck fracture. EXAM: CT OF THE LEFT HIP WITHOUT CONTRAST TECHNIQUE: Multidetector CT imaging of the left hip was performed according to the standard protocol. Multiplanar CT image reconstructions were also generated. COMPARISON:  None. FINDINGS: Bones/Joint/Cartilage Acute impacted subcapital left femoral neck fracture with impaction along the superolateral aspect of the femoral head-neck junction. Slight valgus configuration of the left femur as a result of the fracture with fracture lucency along the medial aspect of the left femoral neck also visualized. Ligaments Suboptimally assessed by CT.  Muscles and Tendons Unremarkable Soft tissues Mild soft tissue contusion about the posterolateral aspect of the left hip. IMPRESSION: Acute  impacted subcapital left femoral neck fracture with slight valgus configuration of the femur. No suspicious lesions to account for the fracture identified. Electronically Signed   By: Ashley Royalty M.D.   On: 01/10/2018 23:39   Dg Chest Portable 1 View  Result Date: 01/10/2018 CLINICAL DATA:  Weakness.  Syncope. EXAM: PORTABLE CHEST 1 VIEW COMPARISON:  None. FINDINGS: The cardiomediastinal silhouette is within normal limits. The lungs are hyperinflated and clear. No pleural effusion or pneumothorax is identified. No acute osseous abnormality is seen. IMPRESSION: No active disease. Electronically Signed   By: Logan Bores M.D.   On: 01/10/2018 16:40   Dg Hip Unilat With Pelvis 2-3 Views Left  Result Date: 01/10/2018 CLINICAL DATA:  Fall today while walking.  Left hip pain. EXAM: DG HIP (WITH OR WITHOUT PELVIS) 2-3V LEFT COMPARISON:  None. FINDINGS: Mild diffuse osteopenia. Minimally displaced subcapital fracture of the left femoral neck. Mild degenerative change of the right hip and spine. IMPRESSION: Minimally displaced subcapital fracture of the left femoral neck. Electronically Signed   By: Marin Olp M.D.   On: 01/10/2018 16:41    EKG:   Orders placed or performed during the hospital encounter of 01/10/18  . ED EKG  . ED EKG    ASSESSMENT AND PLAN:   72 year old female with past medical history significant for breast cancer history, ongoing smoking presents to hospital secondary to fall and left subcapital femoral fracture.  1.  Left femoral neck fracture-secondary to mechanical fall. -Appreciate orthopedics consult. To OR today for ORIF -To new pain control.  Physical therapy will be started tomorrow, DVT prophylaxis tomorrow. -Monitor postop hemoglobin -Check urine analysis-patient having fevers  2.  Tobacco use disorder-on nicotine  patch  3.  History of breast cancer-patient is status post right mastectomy -No outpatient follow-ups with oncology noted.  4.  DVT prophylaxis-Lovenox can be started after surgery. -Seen in order for subcu heparin on the chart, but since patient is going for the surgery-hopefully it might not have been given.  5. HTN- peri op metoprolol started- patient not on meds at home for the same. Monitor    All the records are reviewed and case discussed with Care Management/Social Workerr. Management plans discussed with the patient, family and they are in agreement.  CODE STATUS: DNR  TOTAL TIME TAKING CARE OF THIS PATIENT: 38 minutes.   POSSIBLE D/C IN 1-2 DAYS, DEPENDING ON CLINICAL CONDITION.   Gladstone Lighter M.D on 01/11/2018 at 12:30 PM  Between 7am to 6pm - Pager - [phone]  After 6pm go to [email or web address] - password [email or web address] [facility]  Office  [phone]  CC: Primary care physician; [employer], [employer]

## 2018-01-11 NOTE — Consult Note (Signed)
ORTHOPAEDIC CONSULTATION  REQUESTING PHYSICIAN: Gladstone Lighter, MD  Chief Complaint: hip pain  HPI: Janet [name] is a 72 y.o. female who complains of left hip pain after a fall. Please see H&P and ED notes for details. She has a history significant for untreated breast cancer. No numbness, tingling or constitutional symptoms.  Past Medical History:  Diagnosis Date  . Cancer [facility])    Breast   History reviewed. No pertinent surgical history. Social History   Socioeconomic History  . Marital status: Divorced    Spouse name: Not on file  . Number of children: Not on file  . Years of education: Not on file  . Highest education level: Not on file  Occupational History  . Not on file  Social Needs  . Financial resource strain: Not on file  . Food insecurity:    Worry: Not on file    Inability: Not on file  . Transportation needs:    Medical: Not on file    Non-medical: Not on file  Tobacco Use  . Smoking status: Current Every Day Smoker    Packs/day: 0.50    Types: Cigarettes  . Smokeless tobacco: Never Used  Substance and Sexual Activity  . Alcohol use: Not Currently  . Drug use: Never  . Sexual activity: Not Currently  Lifestyle  . Physical activity:    Days per week: Not on file    Minutes per session: Not on file  . Stress: Not on file  Relationships  . Social connections:    Talks on phone: Not on file    Gets together: Not on file    Attends religious service: Not on file    Active member of club or organization: Not on file    Attends meetings of clubs or organizations: Not on file    Relationship status: Not on file  Other Topics Concern  . Not on file  Social History Narrative  . Not on file   History reviewed. No pertinent family history. Allergies  Allergen Reactions  . Lactose Intolerance (Gi) Other (See Comments)   Prior to Admission medications   Not on File   Ct Hip Left Wo Contrast  Result Date: 01/10/2018 CLINICAL DATA:   Untreated breast cancer with acute left femoral neck fracture. EXAM: CT OF THE LEFT HIP WITHOUT CONTRAST TECHNIQUE: Multidetector CT imaging of the left hip was performed according to the standard protocol. Multiplanar CT image reconstructions were also generated. COMPARISON:  None. FINDINGS: Bones/Joint/Cartilage Acute impacted subcapital left femoral neck fracture with impaction along the superolateral aspect of the femoral head-neck junction. Slight valgus configuration of the left femur as a result of the fracture with fracture lucency along the medial aspect of the left femoral neck also visualized. Ligaments Suboptimally assessed by CT. Muscles and Tendons Unremarkable Soft tissues Mild soft tissue contusion about the posterolateral aspect of the left hip. IMPRESSION: Acute impacted subcapital left femoral neck fracture with slight valgus configuration of the femur. No suspicious lesions to account for the fracture identified. Electronically Signed   By: Ashley Royalty M.D.   On: 01/10/2018 23:39   Dg Chest Portable 1 View  Result Date: 01/10/2018 CLINICAL DATA:  Weakness.  Syncope. EXAM: PORTABLE CHEST 1 VIEW COMPARISON:  None. FINDINGS: The cardiomediastinal silhouette is within normal limits. The lungs are hyperinflated and clear. No pleural effusion or pneumothorax is identified. No acute osseous abnormality is seen. IMPRESSION: No active disease. Electronically Signed   By: Logan Bores M.D.   On:  01/10/2018 16:40   Dg Hip Unilat With Pelvis 2-3 Views Left  Result Date: 01/10/2018 CLINICAL DATA:  Fall today while walking.  Left hip pain. EXAM: DG HIP (WITH OR WITHOUT PELVIS) 2-3V LEFT COMPARISON:  None. FINDINGS: Mild diffuse osteopenia. Minimally displaced subcapital fracture of the left femoral neck. Mild degenerative change of the right hip and spine. IMPRESSION: Minimally displaced subcapital fracture of the left femoral neck. Electronically Signed   By: Marin Olp M.D.   On: 01/10/2018  16:41    Positive ROS: All other systems have been reviewed and were otherwise negative with the exception of those mentioned in the HPI and as above.  Physical Exam: General: Alert, no acute distress Cardiovascular: No pedal edema Respiratory: No cyanosis, no use of accessory musculature GI: No organomegaly, abdomen is soft and non-tender Skin: No lesions in the area of chief complaint Neurologic: Sensation intact distally Psychiatric: Patient is competent for consent with normal mood and affect Lymphatic: No axillary or cervical lymphadenopathy  MUSCULOSKELETAL: left lower extremity pain with IR/ER, motor and sensory intact distally  Assessment: Left hip closed femoral neck fracture with valgus impaction  Plan: Plan a closed reduction with percutaneous pinning when medically stable. Potassium to be replaced. Will get CT scan to rule out pathologic fracture.   The diagnosis, risks, benefits and alternatives to treatment are all discussed in detail with the patient and family. Risks include but are not limited to bleeding, infection, deep vein thrombosis, pulmonary embolism, nerve or vascular injury, non-union, repeat operation, persistent pain, weakness, stiffness and death. She understands and is eager to proceed.     Lovell [name], MD    01/11/2018 11:02 AM

## 2018-01-11 NOTE — Anesthesia Post-op Follow-up Note (Signed)
Anesthesia QCDR form completed.        

## 2018-01-11 NOTE — Progress Notes (Signed)
MRSA PCR sent 

## 2018-01-11 NOTE — Op Note (Signed)
01/10/2018 - 01/11/2018  1:17 PM  PATIENT:  Janet [name]    PRE-OPERATIVE DIAGNOSIS:  Left hip fracture  POST-OPERATIVE DIAGNOSIS:  Same  PROCEDURE:  CANNULATED HIP PINNING  SURGEON:  Lovell [name], MD     ANESTHESIA:   Spinal  PREOPERATIVE INDICATIONS:  Janet [name] is a  72 y.o. female who fell and was found to have a diagnosis of Left hip fracture who elected for surgical management.    The risks benefits and alternatives were discussed with the patient preoperatively including but not limited to the risks of infection, bleeding, nerve injury, cardiopulmonary complications, blood clots, malunion, nonunion, avascular necrosis, the need for revision surgery, the potential for conversion to hemiarthroplasty, among others, and the patient was willing to proceed.  OPERATIVE IMPLANTS: 6.5 mm cannulated screws x3  OPERATIVE FINDINGS: Clinical osteoporosis with weak bone, proximal femur  OPERATIVE PROCEDURE: The patient was brought to the operating room and placed in supine position. IV antibiotics were given. General anesthesia administered.  The patient was placed on the fracture table. The operative extremity was positioned, without any significant reduction maneuver and was prepped and draped in usual sterile fashion.  Time out was performed.  Small incision was made distal to the greater trochanter, and 3 guidewires were introduced into the head and neck. The lengths were measured. The reduction was slightly valgus, and near-anatomic. I opened the cortex with a cannulated drill, and then placed the screws into position. The inferior screw was fully threaded to prevent compression. Satisfactory fixation was achieved.  The wounds were irrigated copiously, and repaired with 2-0 Vicryl and staples for the skin. A sterile dressing was applied. Sponge and needle count were correct.  There no apparent complications and the patient tolerated the procedure well.  Janet Aquas. Harlow Mares, MD

## 2018-01-11 NOTE — Clinical Social Work Note (Signed)
The CSW met with the patient at bedside to discuss discharge planning. The patient is currently not interested in SNF should PT recommend such. The CSW will follow up once PT has evaluated the patient to ensure that the patient and her family understand her level of care needs. CSW is following.  Santiago Bumpers, MSW, Latanya Presser [phone]

## 2018-01-11 NOTE — Anesthesia Procedure Notes (Addendum)
Spinal  Patient location during procedure: OR Start time: 01/11/2018 12:19 PM End time: 01/11/2018 12:21 PM Staffing Anesthesiologist: Karenz, Andrew, MD Resident/CRNA: Bachich, Jennifer, CRNA Performed: resident/CRNA  Preanesthetic Checklist Completed: patient identified, site marked, surgical consent, pre-op evaluation, timeout performed, IV checked, risks and benefits discussed and monitors and equipment checked Spinal Block Patient position: right lateral decubitus Prep: ChloraPrep Patient monitoring: heart rate, continuous pulse ox, blood pressure and cardiac monitor Approach: midline Location: L3-4 Injection technique: single-shot Needle Needle type: Whitacre and Introducer  Needle gauge: 24 G Needle length: 9 cm Assessment Sensory level: T10 Additional Notes Negative paresthesia. Negative blood return. Positive free-flowing CSF. Expiration date of kit checked and confirmed. Patient tolerated procedure well, without complications.       

## 2018-01-11 NOTE — Progress Notes (Signed)
Pt arrived back to room 143, surgical dressing clean dry and intact. No complaints of pain. Bed in lowest position call bell in reach and bed alarm on.

## 2018-01-12 ENCOUNTER — Inpatient Hospital Stay: Payer: Medicare HMO

## 2018-01-12 DIAGNOSIS — E43 Unspecified severe protein-calorie malnutrition: Secondary | ICD-10-CM

## 2018-01-12 LAB — CBC
HCT: 35.4 % — ABNORMAL LOW (ref 36.0–46.0)
Hemoglobin: 11.5 g/dL — ABNORMAL LOW (ref 12.0–15.0)
MCH: 31.7 pg (ref 26.0–34.0)
MCHC: 32.5 g/dL (ref 30.0–36.0)
MCV: 97.5 fL (ref 80.0–100.0)
Platelets: 234 10*3/uL (ref 150–400)
RBC: 3.63 MIL/uL — ABNORMAL LOW (ref 3.87–5.11)
RDW: 12.6 % (ref 11.5–15.5)
WBC: 13.6 10*3/uL — ABNORMAL HIGH (ref 4.0–10.5)
nRBC: 0 % (ref 0.0–0.2)

## 2018-01-12 LAB — BASIC METABOLIC PANEL
Anion gap: 6 (ref 5–15)
BUN: 7 mg/dL — ABNORMAL LOW (ref 8–23)
CO2: 26 mmol/L (ref 22–32)
Calcium: 8.1 mg/dL — ABNORMAL LOW (ref 8.9–10.3)
Chloride: 106 mmol/L (ref 98–111)
Creatinine, Ser: 0.55 mg/dL (ref 0.44–1.00)
GFR calc Af Amer: >60 mL/min (ref >60)
GFR calc non Af Amer: >60 mL/min (ref >60)
Glucose, Bld: 127 mg/dL — ABNORMAL HIGH (ref 70–99)
Potassium: 3.8 mmol/L (ref 3.5–5.1)
Sodium: 138 mmol/L (ref 135–145)

## 2018-01-12 LAB — URINALYSIS, COMPLETE (UACMP) WITH MICROSCOPIC
Bilirubin Urine: NEGATIVE
Glucose, UA: NEGATIVE mg/dL
Ketones, ur: NEGATIVE mg/dL
Nitrite: POSITIVE — AB
Specific Gravity, Urine: 1.02 (ref 1.005–1.030)
WBC, UA: >50 WBC/hpf (ref 0–5)
pH: 7 (ref 5.0–8.0)

## 2018-01-12 MED ORDER — LEVOFLOXACIN 500 MG PO TABS
500.0000 mg | ORAL_TABLET | Freq: Every day | ORAL | Status: DC
  Administered 2018-01-12 – 2018-01-13 (×2): 500 mg via ORAL
  Filled 2018-01-12 (×2): qty 1

## 2018-01-12 MED ORDER — IPRATROPIUM-ALBUTEROL 0.5-2.5 (3) MG/3ML IN SOLN: 3.0000 mL | Freq: Once | RESPIRATORY_TRACT | Status: DC

## 2018-01-12 MED ORDER — ENSURE ENLIVE PO LIQD
237.0000 mL | Freq: Two times a day (BID) | ORAL | Status: DC
  Administered 2018-01-12 – 2018-01-14 (×4): 237 mL via ORAL

## 2018-01-12 MED ORDER — AMLODIPINE BESYLATE 5 MG PO TABS
2.5000 mg | ORAL_TABLET | Freq: Every day | ORAL | Status: DC
  Administered 2018-01-12 – 2018-01-14 (×3): 2.5 mg via ORAL
  Filled 2018-01-12 (×3): qty 1

## 2018-01-12 MED ORDER — ENOXAPARIN SODIUM 40 MG/0.4ML ~~LOC~~ SOLN
40.0000 mg | SUBCUTANEOUS | Status: DC
  Administered 2018-01-12: 40 mg via SUBCUTANEOUS
  Filled 2018-01-12: qty 0.4

## 2018-01-12 MED ORDER — IPRATROPIUM-ALBUTEROL 0.5-2.5 (3) MG/3ML IN SOLN
3.0000 mL | RESPIRATORY_TRACT | Status: DC | PRN
  Administered 2018-01-13: 3 mL via RESPIRATORY_TRACT
  Filled 2018-01-12: qty 3

## 2018-01-12 NOTE — Progress Notes (Signed)
[facility] at [place] NAME: Janet [name]    MR#:  [id number]  DATE OF BIRTH:  10/06/1945  SUBJECTIVE:  CHIEF COMPLAINT:   Chief Complaint  Patient presents with  . Fall   -Postop day 1 after left femoral neck fracture surgery.  Patient complains of pain in the left hip. -No further fevers.  Dyspnea noted.  REVIEW OF SYSTEMS:  Review of Systems  Constitutional: Negative for chills, fever and malaise/fatigue.  HENT: Negative for congestion, ear discharge, hearing loss and nosebleeds.   Eyes: Negative for blurred vision and double vision.  Respiratory: Negative for cough, shortness of breath and wheezing.        Dyspnea  Cardiovascular: Negative for chest pain and palpitations.  Gastrointestinal: Negative for abdominal pain, constipation, diarrhea, nausea and vomiting.  Genitourinary: Negative for dysuria and urgency.  Musculoskeletal: Positive for falls, joint pain and myalgias.  Neurological: Negative for dizziness, focal weakness, seizures, weakness and headaches.  Psychiatric/Behavioral: Negative for depression.    DRUG ALLERGIES:   Allergies  Allergen Reactions  . Lactose Intolerance (Gi) Other (See Comments)    VITALS:  Blood pressure (!) 168/59, pulse 95, temperature 99.1 F (37.3 C), temperature source Oral, resp. rate 15, height 5\' 1"  (1.549 m), weight 43 kg, SpO2 91 %.  PHYSICAL EXAMINATION:  Physical Exam  GENERAL:  72 y.o.-year-old thin built and ill nourished patient lying in the bed with no acute distress.  EYES: Pupils equal, round, reactive to light and accommodation. No scleral icterus. Extraocular muscles intact.  HEENT: Head atraumatic, normocephalic. Oropharynx and nasopharynx clear.  NECK:  Supple, no jugular venous distention. No thyroid enlargement, no tenderness.  Status post right mastectomy LUNGS: Normal breath sounds bilaterally, minimal scattered wheezing noted, no rales,rhonchi or crepitation. No  use of accessory muscles of respiration.  Decreased bibasilar breath sounds noted.  CARDIOVASCULAR: S1, S2 normal. No  rubs, or gallops.  2/6 systolic murmur is present ABDOMEN: Soft, nontender, nondistended. Bowel sounds present. No organomegaly or mass.  EXTREMITIES: No pedal edema, cyanosis, or clubbing.  Left hip dressing in place.  No drains noted. NEUROLOGIC: Cranial nerves II through XII are intact. Muscle strength 5/5 in all extremities except the left leg due to pain.  Sensation intact. Gait not checked.  PSYCHIATRIC: The patient is alert and oriented x 3.  SKIN: No obvious rash, lesion, or ulcer.    LABORATORY PANEL:   CBC Recent Labs  Lab 01/12/18 0446  WBC 13.6*  HGB 11.5*  HCT 35.4*  PLT 234   ------------------------------------------------------------------------------------------------------------------  Chemistries  Recent Labs  Lab 01/11/18 0332 01/12/18 0446  NA 141 138  K 4.0 3.8  CL 107 106  CO2 27 26  GLUCOSE 147* 127*  BUN 11 7*  CREATININE 0.52 0.55  CALCIUM 8.9 8.1*  MG 2.0  --    ------------------------------------------------------------------------------------------------------------------  Cardiac Enzymes Recent Labs  Lab 01/10/18 1551  TROPONINI <0.03   ------------------------------------------------------------------------------------------------------------------  RADIOLOGY:  Dg Chest 2 View  Result Date: 01/12/2018 CLINICAL DATA:  Status post ORIF. Fever and cough. Smoking history. EXAM: CHEST - 2 VIEW COMPARISON:  One-view chest x-ray 01/10/2018 FINDINGS: Heart size normal. Atherosclerotic changes are present at the aorta. Changes of COPD are again noted. There is subtle increased airspace disease in the right upper lobe. Early infection is not excluded. IMPRESSION: 1. Subtle right upper lobe airspace disease raises possibility of early pneumonia. 2. Changes of COPD without other focal airspace disease. Electronically Signed  By:  San Morelle M.D.   On: 01/12/2018 08:48   Ct Hip Left Wo Contrast  Result Date: 01/10/2018 CLINICAL DATA:  Untreated breast cancer with acute left femoral neck fracture. EXAM: CT OF THE LEFT HIP WITHOUT CONTRAST TECHNIQUE: Multidetector CT imaging of the left hip was performed according to the standard protocol. Multiplanar CT image reconstructions were also generated. COMPARISON:  None. FINDINGS: Bones/Joint/Cartilage Acute impacted subcapital left femoral neck fracture with impaction along the superolateral aspect of the femoral head-neck junction. Slight valgus configuration of the left femur as a result of the fracture with fracture lucency along the medial aspect of the left femoral neck also visualized. Ligaments Suboptimally assessed by CT. Muscles and Tendons Unremarkable Soft tissues Mild soft tissue contusion about the posterolateral aspect of the left hip. IMPRESSION: Acute impacted subcapital left femoral neck fracture with slight valgus configuration of the femur. No suspicious lesions to account for the fracture identified. Electronically Signed   By: Ashley Royalty M.D.   On: 01/10/2018 23:39   Dg Chest Portable 1 View  Result Date: 01/10/2018 CLINICAL DATA:  Weakness.  Syncope. EXAM: PORTABLE CHEST 1 VIEW COMPARISON:  None. FINDINGS: The cardiomediastinal silhouette is within normal limits. The lungs are hyperinflated and clear. No pleural effusion or pneumothorax is identified. No acute osseous abnormality is seen. IMPRESSION: No active disease. Electronically Signed   By: Logan Bores M.D.   On: 01/10/2018 16:40   Dg Hip Operative Unilat W Or W/o Pelvis Left  Result Date: 01/11/2018 CLINICAL DATA:  Hip fracture repair EXAM: OPERATIVE left HIP (WITH PELVIS IF PERFORMED) to VIEWS TECHNIQUE: Fluoroscopic spot image(s) were submitted for interpretation post-operatively. COMPARISON:  CT from earlier today FINDINGS: Intra procedural fluoroscopy shows cannulated screw fixation of the  left femoral neck fracture. The joint is located. No acute finding. IMPRESSION: Fluoroscopy for left femoral neck fracture fixation. Electronically Signed   By: Monte Fantasia M.D.   On: 01/11/2018 13:27   Dg Hip Unilat With Pelvis 2-3 Views Left  Result Date: 01/10/2018 CLINICAL DATA:  Fall today while walking.  Left hip pain. EXAM: DG HIP (WITH OR WITHOUT PELVIS) 2-3V LEFT COMPARISON:  None. FINDINGS: Mild diffuse osteopenia. Minimally displaced subcapital fracture of the left femoral neck. Mild degenerative change of the right hip and spine. IMPRESSION: Minimally displaced subcapital fracture of the left femoral neck. Electronically Signed   By: Marin Olp M.D.   On: 01/10/2018 16:41    EKG:   Orders placed or performed during the hospital encounter of 01/10/18  . ED EKG  . ED EKG    ASSESSMENT AND PLAN:   72 year old female with past medical history significant for breast cancer history, ongoing smoking presents to hospital secondary to fall and left subcapital femoral fracture.  1.  Left femoral neck fracture-secondary to mechanical fall. -Appreciate orthopedics consult.  Postop day 1 today -  pain control.  Physical therapy today -dropped but stable postop hemoglobin -Check urine analysis  2.  Tobacco use disorder-on nicotine patch -Likely has underlying COPD from smoking.  Wheezing this morning.  Discontinue IV fluids and start duo nebs  3.  History of breast cancer-patient is status post right mastectomy -No outpatient follow-ups with oncology noted.  4.  DVT prophylaxis-Lovenox   5. HTN- peri op metoprolol started- patient not on meds at home for the same. Monitor -Add Norvasc if needed  6.  Fever-check urine analysis.  WBC elevated and chest x-ray with possible right upper lobe pneumonia.  We will  add antibiotics    All the records are reviewed and case discussed with Care Management/Social Workerr. Management plans discussed with the patient, family and they  are in agreement.  CODE STATUS: DNR  TOTAL TIME TAKING CARE OF THIS PATIENT: 38 minutes.   POSSIBLE D/C IN 1-2 DAYS, DEPENDING ON CLINICAL CONDITION.   Kazim Corrales M.D on 01/12/2018 at 8:52 AM  Between 7am to 6pm - Pager - [phone]  After 6pm go to [email or web address] - password [email or web address] [facility]  Office  [phone]  CC: Primary care physician; [employer]

## 2018-01-12 NOTE — Clinical Social Work Note (Signed)
Clinical Social Work Assessment  Patient Details  Name: Janet [name] MRN: [id number] Date of Birth: [birth year]  Date of referral:  01/12/18               Reason for consult:  Facility Placement                Permission sought to share information with:  Facility Art therapist granted to share information::  Yes, Verbal Permission Granted  Name::        Agency::     Relationship::     Contact Information:     Housing/Transportation Living arrangements for the past 2 months:  Apartment Source of Information:  Patient, Medical Team Patient Interpreter Needed:  None Criminal Activity/Legal Involvement Pertinent to Current Situation/Hospitalization:  No - Comment as needed Significant Relationships:  Adult Children, Other Family Members Lives with:  Self Do you feel safe going back to the place where you live?  Yes Need for family participation in patient care:  No (Coment)  Care giving concerns:  PT recommendation for SNF   Social Worker assessment / plan: The CSW visited the patient at bedside to advise of recommendation for SNF from PT. The patient had verbalized to her nurse that she would consider SNF; after the RN advised of such to the CSW, the CSW sent the referral. The patient upon speaking to the CSW again has now said that she would prefer to return home. The CSW has attempted to contact the patient's daughter to discuss whether or not the patient would be safe to do so and would have supports. The CSW did not reach anyone and was unable to leave voicemail. The CSW will follow-up with the patient at a later time and continue to attempt contact with family. The main concern, aside from the patient's fall risk in the home, is her need for wound care both for her hip repair incision site and her mastectomy site.  The CSW is following.   Employment status:  Retired Nurse, adult PT Recommendations:  [place] / Referral to community resources:  [place]  Patient/Family's Response to care:  The patient was gracious to the [place].  Patient/Family's Understanding of and Emotional Response to Diagnosis, Current Treatment, and Prognosis: The patient seems to have limited insight into her needs and abilities.   Emotional Assessment Appearance:  Appears stated age Attitude/Demeanor/Rapport:  Gracious Affect (typically observed):  Stable Orientation:  Oriented to Self, Oriented to Place, Oriented to  Time, Oriented to Situation Alcohol / Substance use:  Never Used Psych involvement (Current and /or in the community):  No (Comment)  Discharge Needs  Concerns to be addressed:  Care Coordination, Discharge Planning Concerns Readmission within the last 30 days:  No Current discharge risk:  Chronically ill, Physical Impairment, Lives alone Barriers to Discharge:  Continued Medical Work up   [employer], LCSW 01/12/2018, 3:07 PM

## 2018-01-12 NOTE — Progress Notes (Signed)
MD Harlow Mares notified of xray and CT results.

## 2018-01-12 NOTE — NC FL2 (Signed)
[place] LEVEL OF CARE SCREENING TOOL     IDENTIFICATION  Patient Name: Janet [name] Birthdate: [birth year] Sex: female Admission Date (Current Location): 01/10/2018  [place] and [place] Number:  Engineering geologist and Address:  [facility], [address] [place], [place] [address]      Provider Number: [id number]  Attending Physician Name and Address:  Gladstone Lighter, MD  Relative Name and Phone Number:  [place]y [place] (Daughter) [phone]    Current Level of Care: Hospital Recommended Level of Care: [place] Prior Approval Number:    Date Approved/Denied:   PASRR Number: [id number]  Discharge Plan: SNF    Current Diagnoses: Patient Active Problem List   Diagnosis Date Noted  . Hip fx ([place]) 01/10/2018    Orientation RESPIRATION BLADDER Height & Weight     Self, Time, Situation, Place  Normal Continent Weight: 94 lb 14.4 oz (43 kg) Height:  5\' 1"  (154.9 cm)  BEHAVIORAL SYMPTOMS/MOOD NEUROLOGICAL BOWEL NUTRITION STATUS      Continent    AMBULATORY STATUS COMMUNICATION OF NEEDS Skin   Extensive Assist Verbally Surgical wounds                       Personal Care Assistance Level of Assistance  Bathing, Feeding, Dressing Bathing Assistance: Maximum assistance Feeding assistance: Independent Dressing Assistance: Maximum assistance     Functional Limitations Info  Sight, Hearing, Speech Sight Info: Adequate Hearing Info: Adequate Speech Info: Adequate    SPECIAL CARE FACTORS FREQUENCY  PT (By licensed PT)     PT Frequency: Up to 5X per week              Contractures Contractures Info: Not present    Additional Factors Info  Code Status, Allergies Code Status Info: DNR Allergies Info: Lactose Intolerance (Gi)           Current Medications (01/12/2018):  This is the current hospital active medication list Current Facility-Administered Medications  Medication Dose Route  Frequency Provider Last Rate Last Dose  . acetaminophen (TYLENOL) tablet 650 mg  650 mg Oral Q6H PRN Lovell [name], MD   650 mg at 01/12/18 0754   Or  . acetaminophen (TYLENOL) suppository 650 mg  650 mg Rectal Q6H PRN Lovell [name], MD      . amLODipine (NORVASC) tablet 2.5 mg  2.5 mg Oral Daily Gladstone Lighter, MD   2.5 mg at 01/12/18 0909  . bisacodyl (DULCOLAX) EC tablet 5 mg  5 mg Oral Daily PRN Lovell [name], MD      . chlorhexidine (HIBICLENS) 4 % liquid 1 application  1 application Topical Once Earnestine Leys, MD      . docusate sodium (COLACE) capsule 100 mg  100 mg Oral BID Lovell [name], MD      . docusate sodium (COLACE) capsule 100 mg  100 mg Oral BID Lovell [name], MD   100 mg at 01/12/18 0737  . enoxaparin (LOVENOX) injection 40 mg  40 mg Subcutaneous Q24H Gladstone Lighter, MD   40 mg at 01/12/18 1406  . feeding supplement (ENSURE ENLIVE) (ENSURE ENLIVE) liquid 237 mL  237 mL Oral BID BM Gladstone Lighter, MD   237 mL at 01/12/18 1406  . hydrALAZINE (APRESOLINE) injection 10 mg  10 mg Intravenous Q4H PRN Lovell [name], MD      . HYDROcodone-acetaminophen (NORCO/VICODIN) 5-325 MG per tablet 1-2 tablet  1-2 tablet Oral Q4H PRN Kurtis Bushman  R, MD   2 tablet at 01/11/18 1848  . ipratropium-albuterol (DUONEB) 0.5-2.5 (3) MG/3ML nebulizer solution 3 mL  3 mL Nebulization Once Gladstone Lighter, MD      . ipratropium-albuterol (DUONEB) 0.5-2.5 (3) MG/3ML nebulizer solution 3 mL  3 mL Nebulization Q4H PRN Gladstone Lighter, MD      . levofloxacin (LEVAQUIN) tablet 500 mg  500 mg Oral Daily Gladstone Lighter, MD   500 mg at 01/12/18 0909  . metoCLOPramide (REGLAN) tablet 5-10 mg  5-10 mg Oral Q8H PRN Lovell [name], MD       Or  . metoCLOPramide (REGLAN) injection 5-10 mg  5-10 mg Intravenous Q8H PRN Lovell [name], MD      . metoprolol tartrate (LOPRESSOR) tablet 25 mg  25 mg Oral BID Lovell [name], MD   25 mg at 01/12/18 2725  . morphine 2 MG/ML injection 2 mg  2  mg Intravenous Q2H PRN Lovell [name], MD   2 mg at 01/10/18 2109  . multivitamin with minerals tablet 1 tablet  1 tablet Oral Daily Lovell [name], MD   1 tablet at 01/10/18 2101  . nicotine (NICODERM CQ - dosed in mg/24 hours) patch 14 mg  14 mg Transdermal Daily Lovell [name], MD   14 mg at 01/12/18 0911  . ondansetron (ZOFRAN) tablet 4 mg  4 mg Oral Q6H PRN Lovell [name], MD       Or  . ondansetron [facility]) injection 4 mg  4 mg Intravenous Q6H PRN Lovell [name], MD      . ondansetron [facility]) tablet 4 mg  4 mg Oral Q6H PRN Lovell [name], MD       Or  . ondansetron [facility]) injection 4 mg  4 mg Intravenous Q6H PRN Lovell [name], MD      . polyethylene glycol (MIRALAX / GLYCOLAX) packet 17 g  17 g Oral Daily PRN Lovell [name], MD         Discharge Medications: Please see discharge summary for a list of discharge medications.  Relevant Imaging Results:  Relevant Lab Results:   Additional Information SS# [id number]  Zettie Pho, LCSW

## 2018-01-12 NOTE — Evaluation (Signed)
Physical Therapy Evaluation Patient Details Name: Janet [name] MRN: [id number] DOB: 02-Jul-1945 Today's Date: 01/12/2018   History of Present Illness  Patient is a pleasant 72 year old female who was admitted for hip fx from fall and is now s/p ORIF. She has a history significant for untreated breast cancer. Patient reports multiple falls and lives alone. She does not use an assistive device.   Clinical Impression  Patient presents with generalized weakness and limited mobility s/p L ORIF from hip fx. Patient requires Min -Mod A for bed mobility and transfers and is unable to perform LLE active ROM without assistance.  She currently has untreated breast cancer and has very low body fat percentage per observation. Patient will benefit from skilled physical therapy to improve mobility, reduce pain, and return strength and ROM to LLE. Upon d/c patient will benefit from placement in SNF due to limited mobility placing patient at high fall risk and high risk for skin degradation/breakdown, SNF placement will allow patient to regain mobility in a safe environment and return to PLOF.     Follow Up Recommendations SNF    Equipment Recommendations  Standard walker    Recommendations for Other Services       Precautions / Restrictions Precautions Precaution Comments: L ORIF Restrictions Weight Bearing Restrictions: Yes LLE Weight Bearing: Weight bearing as tolerated      Mobility  Bed Mobility Overal bed mobility: Needs Assistance Bed Mobility: Supine to Sit     Supine to sit: Mod assist;HOB elevated     General bed mobility comments: Patient requires mod A for bed mobility with PT providing support and Mod-Max A for movement of LLE. Requires MIn A to stabilize into seated position  Transfers Overall transfer level: Needs assistance Equipment used: Standard walker Transfers: Sit to/from Stand;Stand Pivot Transfers Sit to Stand: Min assist Stand pivot transfers: Min assist        General transfer comment: Requires cueing for body mechanics of STS, is challenged with weight shift on LLE< minimal weight bearing performed.   Ambulation/Gait Ambulation/Gait assistance: Mod assist;Min assist   Assistive device: Standard walker       General Gait Details: Ambulate 2 step to chair to sit, toe touch weight bearing due to limited tolerance of weightbearing through LLE with walker.  Mod Verbal cueing for sequencing.   Stairs            Wheelchair Mobility    Modified Rankin (Stroke Patients Only)       Balance Overall balance assessment: Needs assistance Sitting-balance support: Bilateral upper extremity supported;Single extremity supported Sitting balance-Leahy Scale: Poor Sitting balance - Comments: requires at least SUE support for static stability and BUE support for dynamic stability  Postural control: Right lateral lean(limited weight bearing through LLE) Standing balance support: Bilateral upper extremity supported Standing balance-Leahy Scale: Poor Standing balance comment: Limited weight bearing through LLE, Requires use of a walker and CGA                              Pertinent Vitals/Pain Pain Assessment: 0-10 Pain Score: 5  Pain Location: L hip with movement Pain Descriptors / Indicators: Operative site guarding;Stabbing Pain Intervention(s): Monitored during session    Home Living Family/patient expects to be discharged to:: Skilled nursing facility Living Arrangements: Alone               Additional Comments: Patient lives alone in a suite/apt. She does not have grab  bars in the bathroom but does have a shower chair. Has active breast cancer at this time.     Prior Function Level of Independence: Independent         Comments: Patient lives alone, performed her own ADLs per her report. Has had multiple falls in the past and does not utilize an AD.      Hand Dominance        Extremity/Trunk Assessment    Upper Extremity Assessment Upper Extremity Assessment: Generalized weakness    Lower Extremity Assessment Lower Extremity Assessment: RLE deficits/detail;LLE deficits/detail RLE Deficits / Details: 3/5 strength  RLE Sensation: WNL RLE Coordination: WNL LLE Deficits / Details: unable to assess due to pain, requires Mod A to move LLE: Unable to fully assess due to pain LLE Sensation: WNL       Communication   Communication: No difficulties  Cognition Arousal/Alertness: Awake/alert Behavior During Therapy: WFL for tasks assessed/performed Overall Cognitive Status: Within Functional Limits for tasks assessed                                 General Comments: Patient appears thin, with limited strength in non affected limb.       General Comments General comments (skin integrity, edema, etc.): Patietn appears thin with low body fat composition, poor strength of non affected limb indicates limited self care.     Exercises General Exercises - Lower Extremity Ankle Circles/Pumps: AROM;Strengthening;Both;15 reps;Supine Heel Slides: AROM;Strengthening;Right;Left;AAROM;5 reps;Supine Other Exercises Other Exercises: seated EOB tolerance: 3 minutes: requires UE support for stability, frequent lean/near LOB to R.  Other Exercises: Stand, take two steps with LLE with RW and Min A to chair and sit.    Assessment/Plan    PT Assessment Patient needs continued PT services  PT Problem List Decreased strength;Decreased range of motion;Decreased activity tolerance;Decreased balance;Decreased knowledge of use of DME;Decreased coordination;Decreased mobility;Pain       PT Treatment Interventions DME instruction;Gait training;Stair training;Functional mobility training;Neuromuscular re-education;Balance training;Therapeutic exercise;Therapeutic activities;Patient/family education;Manual techniques    PT Goals (Current goals can be found in the Care Plan section)  Acute Rehab PT  Goals Patient Stated Goal: to return to walking and moving independently  PT Goal Formulation: With patient Time For Goal Achievement: 01/26/18 Potential to Achieve Goals: Fair    Frequency BID   Barriers to discharge Inaccessible home environment;Decreased caregiver support Patient lives alone without help. Patient needs assistance for mobility at this time.     Co-evaluation               AM-PAC PT "6 Clicks" Daily Activity  Outcome Measure Difficulty turning over in bed (including adjusting bedclothes, sheets and blankets)?: Unable Difficulty moving from lying on back to sitting on the side of the bed? : Unable Difficulty sitting down on and standing up from a chair with arms (e.g., wheelchair, bedside commode, etc,.)?: Unable Help needed moving to and from a bed to chair (including a wheelchair)?: A Lot Help needed walking in hospital room?: A Lot Help needed climbing 3-5 steps with a railing? : Total 6 Click Score: 8    End of Session Equipment Utilized During Treatment: Gait belt Activity Tolerance: Patient limited by pain Patient left: in chair;with call bell/phone within reach;with chair alarm set;with SCD's reapplied Nurse Communication: Mobility status;Weight bearing status;Other (comment)(patient in chair, will call nurse when finished. Needs ext cath put back in. ) PT Visit Diagnosis: Other abnormalities of gait and  mobility (R26.89);Repeated falls (R29.6);Muscle weakness (generalized) (M62.81);Unsteadiness on feet (R26.81);Pain Pain - Right/Left: Left Pain - part of body: Hip    Time: 6190-1222 PT Time Calculation (min) (ACUTE ONLY): 26 min   Charges:   PT Evaluation $PT Eval Low Complexity: 1 Low PT Treatments $Therapeutic Activity: 8-22 mins        Janna Arch, PT, DPT    Janna Arch 01/12/2018, 10:34 AM

## 2018-01-12 NOTE — Progress Notes (Signed)
Initial Nutrition Assessment  DOCUMENTATION CODES:   Severe malnutrition in context of social or environmental circumstances, Underweight  INTERVENTION:  Provide Ensure Enlive po BID, each supplement provides 350 kcal and 20 grams of protein.  Continue daily MVI.  Discussed increased needs for calories and protein for post-op healing.  NUTRITION DIAGNOSIS:   Severe Malnutrition related to social / environmental circumstances(inadequate oral intake) as evidenced by severe fat depletion, severe muscle depletion.  GOAL:   Patient will meet greater than or equal to 90% of their needs  MONITOR:   PO intake, Supplement acceptance, Labs, Weight trends, Skin, I & O's  REASON FOR ASSESSMENT:   Malnutrition Screening Tool, Consult Assessment of nutrition requirement/status  ASSESSMENT:   72 year old female with PMHx of breast cancer who was admitted after mechanical fall found to have left hip fracture now s/p cannulated hip pinning on 10/19.   Met with patient at bedside. She reports she has had a poor appetite for about one week but she is unsure of etiology. She typically only eats one meal per day at home. She will have boiled eggs, a bagel or toast with apple butter at that meal. She does not drink ONS regularly but is willing to drink them while healing.  Patient reports her UBW is 95 lbs. She is currently 43 kg (94.9 lbs). No weight history in chart to trend.  Medications reviewed and include: Colace, Levaquin, MVI.  Labs reviewed: BUN 7.  NUTRITION - FOCUSED PHYSICAL EXAM:    Most Recent Value  Orbital Region  Severe depletion  Upper Arm Region  Severe depletion  Thoracic and Lumbar Region  Severe depletion  Buccal Region  Severe depletion  Temple Region  Severe depletion  Clavicle Bone Region  Severe depletion  Clavicle and Acromion Bone Region  Severe depletion  Scapular Bone Region  Severe depletion  Dorsal Hand  Severe depletion  Patellar Region  Severe  depletion  Anterior Thigh Region  Severe depletion  Posterior Calf Region  Severe depletion  Edema (RD Assessment)  None  Hair  Reviewed  Eyes  Reviewed  Mouth  Reviewed  Skin  Reviewed  Nails  Reviewed     Diet Order:   Diet Order            Diet regular Room service appropriate? Yes; Fluid consistency: Thin  Diet effective now              EDUCATION NEEDS:   Education needs have been addressed  Skin:  Skin Assessment: Skin Integrity Issues:(closed incision left hip; non pressure wound to left sternum)  Last BM:  PTA (01/09/2018 per chart)  Height:   Ht Readings from Last 1 Encounters:  01/10/18 _0  (1.549 m)    Weight:   Wt Readings from Last 1 Encounters:  01/10/18 43 kg    Ideal Body Weight:  47.7 kg  BMI:  Body mass index is 17.93 kg/m.  Estimated Nutritional Needs:   Kcal:  1200-1400  Protein:  65-75 grams  Fluid:  1.2-1.4 L/day  Willey Blade, MS, RD, LDN Office: [phone] Pager: [phone] After Hours/Weekend Pager: [phone]

## 2018-01-12 NOTE — Progress Notes (Signed)
MD Kalisetti notified of CT and xray results.

## 2018-01-12 NOTE — Progress Notes (Signed)
Pt off floor, at CT. Unable to obtain post fall vitals.

## 2018-01-12 NOTE — Anesthesia Postprocedure Evaluation (Addendum)
Anesthesia Post Note  Patient: Janet [name]  Procedure(s) Performed: CANNULATED HIP PINNING (Left )  Patient location during evaluation: Other (general floor) Anesthesia Type: Spinal Level of consciousness: oriented and awake and alert Pain management: pain level controlled Vital Signs Assessment: post-procedure vital signs reviewed and stable Respiratory status: spontaneous breathing and respiratory function stable Cardiovascular status: stable Postop Assessment: no headache, no backache and no apparent nausea or vomiting Anesthetic complications: no     Last Vitals:  Vitals:   01/12/18 1759 01/12/18 1853  BP: (!) 157/59 (!) 159/56  Pulse: 100 61  Resp:    Temp: 37.6 C   SpO2: 96% 95%    Last Pain:  Vitals:   01/12/18 1759  TempSrc: Oral  PainSc:                  Durenda Hurt

## 2018-01-12 NOTE — Addendum Note (Signed)
Addendum  created 01/12/18 1932 by Durenda Hurt, MD   Sign clinical note

## 2018-01-12 NOTE — Progress Notes (Signed)
Pt had a witness fall ambulating from the [facility] to the bed. Pt states "she hit her head",also states "she does not have any pain". MD Tressia Miners, MD Luz Lex and Pts daughter notified. Orders received for STAT CT head w/c contrast and xray left hip. Pt at baseline, VSS pt in bed reading a book, no complaints. No apparent injuries noted upon assessment.

## 2018-01-13 ENCOUNTER — Encounter: Payer: Self-pay | Admitting: Orthopedic Surgery

## 2018-01-13 DIAGNOSIS — Z515 Encounter for palliative care: Secondary | ICD-10-CM

## 2018-01-13 DIAGNOSIS — S72002D Fracture of unspecified part of neck of left femur, subsequent encounter for closed fracture with routine healing: Secondary | ICD-10-CM

## 2018-01-13 DIAGNOSIS — Z7189 Other specified counseling: Secondary | ICD-10-CM

## 2018-01-13 LAB — BASIC METABOLIC PANEL
Anion gap: 6 (ref 5–15)
BUN: 7 mg/dL — ABNORMAL LOW (ref 8–23)
CO2: 27 mmol/L (ref 22–32)
Calcium: 8.5 mg/dL — ABNORMAL LOW (ref 8.9–10.3)
Chloride: 105 mmol/L (ref 98–111)
Creatinine, Ser: 0.51 mg/dL (ref 0.44–1.00)
GFR calc Af Amer: >60 mL/min (ref >60)
GFR calc non Af Amer: >60 mL/min (ref >60)
Glucose, Bld: 119 mg/dL — ABNORMAL HIGH (ref 70–99)
Potassium: 3.1 mmol/L — ABNORMAL LOW (ref 3.5–5.1)
Sodium: 138 mmol/L (ref 135–145)

## 2018-01-13 LAB — CBC
HCT: 35.6 % — ABNORMAL LOW (ref 36.0–46.0)
Hemoglobin: 12.2 g/dL (ref 12.0–15.0)
MCH: 31.9 pg (ref 26.0–34.0)
MCHC: 34.3 g/dL (ref 30.0–36.0)
MCV: 93.2 fL (ref 80.0–100.0)
Platelets: 265 10*3/uL (ref 150–400)
RBC: 3.82 MIL/uL — ABNORMAL LOW (ref 3.87–5.11)
RDW: 12.5 % (ref 11.5–15.5)
WBC: 11.8 10*3/uL — ABNORMAL HIGH (ref 4.0–10.5)
nRBC: 0 % (ref 0.0–0.2)

## 2018-01-13 MED ORDER — LEVOFLOXACIN 250 MG PO TABS
250.0000 mg | ORAL_TABLET | Freq: Every day | ORAL | Status: DC
  Administered 2018-01-14: 250 mg via ORAL
  Filled 2018-01-13: qty 1

## 2018-01-13 MED ORDER — POTASSIUM CHLORIDE CRYS ER 20 MEQ PO TBCR
40.0000 meq | EXTENDED_RELEASE_TABLET | ORAL | Status: AC
  Administered 2018-01-13 (×2): 40 meq via ORAL
  Filled 2018-01-13 (×2): qty 2

## 2018-01-13 MED ORDER — BISACODYL 10 MG RE SUPP: 10.0000 mg | Freq: Every day | RECTAL | Status: DC | PRN

## 2018-01-13 MED ORDER — DAKINS (1/4 STRENGTH) 0.125 % EX SOLN
Freq: Every day | CUTANEOUS | Status: DC
  Administered 2018-01-13 – 2018-01-14 (×2)
  Filled 2018-01-13: qty 473

## 2018-01-13 MED ORDER — SODIUM CHLORIDE 0.9 % IV SOLN: 1.0000 g | INTRAVENOUS | Status: DC

## 2018-01-13 MED ORDER — ENOXAPARIN SODIUM 30 MG/0.3ML ~~LOC~~ SOLN
30.0000 mg | SUBCUTANEOUS | Status: DC
  Administered 2018-01-13: 30 mg via SUBCUTANEOUS
  Filled 2018-01-13: qty 0.3

## 2018-01-13 NOTE — Progress Notes (Signed)
Subjective:  Patient reports pain as moderate.  Suffered a fall yesterday. CT scan and repeat x-rays were negative for acute changes.  Objective:   VITALS:   Vitals:   01/13/18 0211 01/13/18 0413 01/13/18 0608 01/13/18 0810  BP: (!) 154/64 (!) 145/72 (!) 161/57 (!) 159/75  Pulse: 85 92 100 (!) 102  Resp: 15 14 15    Temp: 98.6 F (37 C) 99 F (37.2 C) 98.6 F (37 C) 98.7 F (37.1 C)  TempSrc: Oral Oral Oral Oral  SpO2: 93% 93% 94% 94%  Weight:      Height:        PHYSICAL EXAM:  Neurovascular intact Sensation intact distally Intact pulses distally Dorsiflexion/Plantar flexion intact Incision: dressing C/D/I Compartment soft  LABS  Results for orders placed or performed during the hospital encounter of 01/10/18 (from the past 24 hour(s))  Urinalysis, Complete w Microscopic     Status: Abnormal   Collection Time: 01/12/18  5:09 PM  Result Value Ref Range   Color, Urine YELLOW YELLOW   APPearance CLOUDY (A) CLEAR   Specific Gravity, Urine 1.020 1.005 - 1.030   pH 7.0 5.0 - 8.0   Glucose, UA NEGATIVE NEGATIVE mg/dL   Hgb urine dipstick MODERATE (A) NEGATIVE   Bilirubin Urine NEGATIVE NEGATIVE   Ketones, ur NEGATIVE NEGATIVE mg/dL   Protein, ur TRACE (A) NEGATIVE mg/dL   Nitrite POSITIVE (A) NEGATIVE   Leukocytes, UA MODERATE (A) NEGATIVE   Squamous Epithelial / LPF 0-5 0 - 5   WBC, UA >50 0 - 5 WBC/hpf   RBC / HPF 21-50 0 - 5 RBC/hpf   Bacteria, UA MANY (A) NONE SEEN   WBC Clumps PRESENT    Mucus PRESENT   CBC     Status: Abnormal   Collection Time: 01/13/18  4:20 AM  Result Value Ref Range   WBC 11.8 (H) 4.0 - 10.5 K/uL   RBC 3.82 (L) 3.87 - 5.11 MIL/uL   Hemoglobin 12.2 12.0 - 15.0 g/dL   HCT 35.6 (L) 36.0 - 46.0 %   MCV 93.2 80.0 - 100.0 fL   MCH 31.9 26.0 - 34.0 pg   MCHC 34.3 30.0 - 36.0 g/dL   RDW 12.5 11.5 - 15.5 %   Platelets 265 150 - 400 K/uL   nRBC 0.0 0.0 - 0.2 %  Basic metabolic panel     Status: Abnormal   Collection Time: 01/13/18  4:20  AM  Result Value Ref Range   Sodium 138 135 - 145 mmol/L   Potassium 3.1 (L) 3.5 - 5.1 mmol/L   Chloride 105 98 - 111 mmol/L   CO2 27 22 - 32 mmol/L   Glucose, Bld 119 (H) 70 - 99 mg/dL   BUN 7 (L) 8 - 23 mg/dL   Creatinine, Ser 0.51 0.44 - 1.00 mg/dL   Calcium 8.5 (L) 8.9 - 10.3 mg/dL   GFR calc non Af Amer >60 >60 mL/min   GFR calc Af Amer >60 >60 mL/min   Anion gap 6 5 - 15    Dg Chest 2 View  Result Date: 01/12/2018 CLINICAL DATA:  Status post ORIF. Fever and cough. Smoking history. EXAM: CHEST - 2 VIEW COMPARISON:  One-view chest x-ray 01/10/2018 FINDINGS: Heart size normal. Atherosclerotic changes are present at the aorta. Changes of COPD are again noted. There is subtle increased airspace disease in the right upper lobe. Early infection is not excluded. IMPRESSION: 1. Subtle right upper lobe airspace disease raises possibility of early pneumonia.  2. Changes of COPD without other focal airspace disease. Electronically Signed   By: San Morelle M.D.   On: 01/12/2018 08:48   Ct Head Wo Contrast  Result Date: 01/12/2018 CLINICAL DATA:  Fall with head trauma.  Initial encounter. EXAM: CT HEAD WITHOUT CONTRAST TECHNIQUE: Contiguous axial images were obtained from the base of the skull through the vertex without intravenous contrast. COMPARISON:  None. FINDINGS: Brain: No evidence of acute infarction, hemorrhage, hydrocephalus, extra-axial collection or mass lesion/mass effect. 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 Ischemic gliosis in the deep cerebral white matter. Vascular: Atherosclerotic calcification Skull: History of breast cancer. There is an irregular sclerotic and mildly expansile lesion in the right frontal calvarium with indistinct zone of margination measuring 4.1 cm in diameter. Sinuses/Orbits: Negative IMPRESSION: 1. No acute finding. 2. Large sclerotic lesion in the right frontal bone suspicious for metastatic deposit in this  patient with history of breast cancer. Please correlate with prior imaging. Bone scan could be obtained on an elective basis. 3. 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 Electronically Signed   By: Monte Fantasia M.D.   On: 01/12/2018 16:11   Dg Hip Operative Unilat W Or W/o Pelvis Left  Result Date: 01/11/2018 CLINICAL DATA:  Hip fracture repair EXAM: OPERATIVE left HIP (WITH PELVIS IF PERFORMED) to VIEWS TECHNIQUE: Fluoroscopic spot image(s) were submitted for interpretation post-operatively. COMPARISON:  CT from earlier today FINDINGS: Intra procedural fluoroscopy shows cannulated screw fixation of the left femoral neck fracture. The joint is located. No acute finding. IMPRESSION: Fluoroscopy for left femoral neck fracture fixation. Electronically Signed   By: Monte Fantasia M.D.   On: 01/11/2018 13:27   Dg Hip Unilat With Pelvis 2-3 Views Left  Result Date: 01/12/2018 CLINICAL DATA:  The patient underwent fixation of a left subcapital fracture 01/11/2018. The patient suffered a fall out of bed after surgery. Subsequent encounter. EXAM: DG HIP (WITH OR WITHOUT PELVIS) 2-3V LEFT COMPARISON:  Plain films left hip 01/10/2018. Intraoperative imaging 01/11/2018. FINDINGS: No acute bony or joint abnormality is identified. Three screws fixing a subcapital fracture of the left hip are unchanged. Position and alignment of the fracture also unchanged. Bones appear osteopenic. IMPRESSION: No acute abnormality. No change in the appearance of the left hip after ORIF. Electronically Signed   By: Inge Rise M.D.   On: 01/12/2018 16:31    Assessment/Plan: 2 Days Post-Op   Active Problems:   Hip fx ([place])   Protein-calorie malnutrition, severe   Advance diet Up with therapy Discharge to SNF OK from orthopedic standpoint. She is WBAT, Lovenox for DVT prophylaxis. RTC in 2 weeks for staple removal. Please call with questions.   Lovell [name] , MD 01/13/2018, 10:47  AM

## 2018-01-13 NOTE — Care Management Important Message (Signed)
Important Message  Patient Details  Name: Janet [name] MRN: [id number] Date of Birth: 05/16/1945   Medicare Important Message Given:  Yes    Juliann Pulse A Ridhima Golberg 01/13/2018, 11:09 AM

## 2018-01-13 NOTE — Clinical Social Work Placement (Signed)
   CLINICAL SOCIAL WORK PLACEMENT  NOTE  Date:  01/13/2018  Patient Details  Name: Janet [name] MRN: [id number] Date of Birth: Oct 14, 1945  Clinical Social Work is seeking post-discharge placement for this patient at the [place] level of care (*CSW will initial, date and re-position this form in  chart as items are completed):  Yes   Patient/family provided with [place] Work Department's list of facilities offering this level of care within the geographic area requested by the patient (or if unable, by the patient's family).  Yes   Patient/family informed of their freedom to choose among providers that offer the needed level of care, that participate in Medicare, Medicaid or managed care program needed by the patient, have an available bed and are willing to accept the patient.  Yes   Patient/family informed of [facility]'s ownership interest in [facility] and [facility], as well as of the fact that they are under no obligation to receive care at these facilities.  PASRR submitted to EDS on 01/12/18     PASRR number received on 01/12/18     Existing PASRR number confirmed on       FL2 transmitted to all facilities in geographic area requested by pt/family on 01/12/18     FL2 transmitted to all facilities within larger geographic area on       Patient informed that his/her managed care company has contracts with or will negotiate with certain facilities, including the following:        Yes   Patient/family informed of bed offers received.  Patient chooses bed at [facility] )     Physician recommends and patient chooses bed at      Patient to be transferred to   on  .  Patient to be transferred to facility by       Patient family notified on   of transfer.  Name of family member notified:        PHYSICIAN Please sign DNR     Additional Comment:    _______________________________________________ Niah Heinle,  Veronia Beets, LCSW 01/13/2018, 11:17 AM

## 2018-01-13 NOTE — Progress Notes (Signed)
Physical Therapy Treatment Patient Details Name: Janet [name] MRN: [id number] DOB: May 04, 1945 Today's Date: 01/13/2018    History of Present Illness Patient is a pleasant 72 year old female who was admitted for hip fx from fall and is now s/p ORIF. She has a history significant for untreated breast cancer. Patient reports multiple falls and lives alone. She does not use an assistive device.     PT Comments    Pt agreeable to PT, but ultimately only for bed exercises. Pt does have a K level of 3.1 without supplementation at the time of treatment; therefore, agreeable to bed exercises only and encouragement for possibly up to chair in afternoon session. Pt denies pain in L hip/LE at rest, but complains of 7/10 pain with movement. Pt participates with supine bed exercises requiring some A on the L and cues for technique and to continue on task throughout session. Rest periods taken between sets/exercises. Continue PT to progress strength and endurance to improve functional mobility.   Follow Up Recommendations  SNF     Equipment Recommendations       Recommendations for Other Services       Precautions / Restrictions Precautions Precautions: Fall Restrictions Weight Bearing Restrictions: Yes LLE Weight Bearing: Weight bearing as tolerated    Mobility  Bed Mobility               General bed mobility comments: Refused out of bed; (also K low 3.1, no supplementation started)  Transfers                    Ambulation/Gait                 Stairs             Wheelchair Mobility    Modified Rankin (Stroke Patients Only)       Balance                                            Cognition Arousal/Alertness: Awake/alert Behavior During Therapy: WFL for tasks assessed/performed Overall Cognitive Status: Within Functional Limits for tasks assessed                                        Exercises General  Exercises - Lower Extremity Ankle Circles/Pumps: AROM;Both;20 reps;Supine Quad Sets: Strengthening;Both;20 reps;Supine Gluteal Sets: Strengthening;Both;20 reps;Supine Short Arc Quad: AROM;Both;20 reps;Supine Heel Slides: AAROM;Left;20 reps;Supine(AROM R) Hip ABduction/ADduction: AAROM;Left;20 reps;Supine(AROM R) Straight Leg Raises: AAROM;Both;Supine;10 reps;Other (comment)(2 sets)    General Comments        Pertinent Vitals/Pain Pain Assessment: 0-10 Pain Score: 7 (with movement; 0 at rest) Pain Location: L hip Pain Intervention(s): Limited activity within patient's tolerance;Monitored during session    Home Living                      Prior Function            PT Goals (current goals can now be found in the care plan section) Progress towards PT goals: Not progressing toward goals - comment    Frequency    BID      PT Plan Current plan remains appropriate    Co-evaluation              AM-PAC  PT "6 Clicks" Daily Activity  Outcome Measure  Difficulty turning over in bed (including adjusting bedclothes, sheets and blankets)?: A Little Difficulty moving from lying on back to sitting on the side of the bed? : Unable Difficulty sitting down on and standing up from a chair with arms (e.g., wheelchair, bedside commode, etc,.)?: Unable Help needed moving to and from a bed to chair (including a wheelchair)?: A Lot Help needed walking in hospital room?: A Lot Help needed climbing 3-5 steps with a railing? : Total 6 Click Score: 10    End of Session   Activity Tolerance: Patient limited by fatigue;Other (comment)(K low 3.1; no supplementation started) Patient left: in bed;with call bell/phone within reach;with bed alarm set;with nursing/sitter in room   PT Visit Diagnosis: Other abnormalities of gait and mobility (R26.89);Repeated falls (R29.6);Muscle weakness (generalized) (M62.81);Unsteadiness on feet (R26.81);Pain Pain - Right/Left: Left Pain - part  of body: Hip     Time: 1155-2080 PT Time Calculation (min) (ACUTE ONLY): 31 min  Charges:  $Therapeutic Exercise: 23-37 mins                      Larae Grooms, PTA 01/13/2018, 1:13 PM

## 2018-01-13 NOTE — Progress Notes (Signed)
Physical Therapy Treatment Patient Details Name: Janet [name] MRN: [id number] DOB: Aug 01, 1945 Today's Date: 01/13/2018    History of Present Illness Patient is a pleasant 72 year old female who was admitted for hip fx from fall and is now s/p ORIF. She has a history significant for untreated breast cancer. Patient reports multiple falls and lives alone. She does not use an assistive device.     PT Comments    Pt agreeable to PT; less pain noted in L hip to 5/10, but mildly increased with weight bearing on L. Pt up in chair and participates in STS transfers, stand weight shifting (difficult to L) and stand LLE exercises as well as seated exercises. Pt comfortable up in chair and noted to prefer sitting with BLE drawn up in tight hooklying position (denies pain). Continue PT to progress strength to improve functional mobility/progress ability to ambulate.    Follow Up Recommendations  SNF     Equipment Recommendations       Recommendations for Other Services       Precautions / Restrictions Precautions Precautions: Fall Restrictions Weight Bearing Restrictions: Yes LLE Weight Bearing: Weight bearing as tolerated    Mobility  Bed Mobility               General bed mobility comments: Up in chair  Transfers Overall transfer level: Needs assistance Equipment used: Rolling walker (2 wheeled) Transfers: Sit to/from Stand Sit to Stand: Min assist         General transfer comment: R shift limiting weight bearing on L  Ambulation/Gait             General Gait Details: Hesitant to take steps, work on stand weight shift and stand with LLE march, fwd and side kicks   Stairs             Wheelchair Mobility    Modified Rankin (Stroke Patients Only)       Balance Overall balance assessment: Needs assistance         Standing balance support: Bilateral upper extremity supported Standing balance-Leahy Scale: Fair                               Cognition Arousal/Alertness: Awake/alert Behavior During Therapy: WFL for tasks assessed/performed Overall Cognitive Status: Within Functional Limits for tasks assessed                                        Exercises General Exercises - Lower Extremity Long Arc Quad: AROM;Left;10 reps;Seated(2 sets) Heel Slides: AROM;Left;20 reps;Seated Hip ABduction/ADduction: AROM;Left;20 reps;Standing Straight Leg Raises: AROM;Left;20 reps;Standing    General Comments        Pertinent Vitals/Pain Pain Assessment: 0-10 Pain Score: 5  Pain Location: L hip Pain Intervention(s): Monitored during session    Home Living                      Prior Function            PT Goals (current goals can now be found in the care plan section) Progress towards PT goals: Progressing toward goals    Frequency    BID      PT Plan Current plan remains appropriate    Co-evaluation              AM-PAC PT "6 Clicks"  Daily Activity  Outcome Measure  Difficulty turning over in bed (including adjusting bedclothes, sheets and blankets)?: A Little Difficulty moving from lying on back to sitting on the side of the bed? : Unable Difficulty sitting down on and standing up from a chair with arms (e.g., wheelchair, bedside commode, etc,.)?: Unable Help needed moving to and from a bed to chair (including a wheelchair)?: A Lot Help needed walking in hospital room?: A Lot Help needed climbing 3-5 steps with a railing? : Total 6 Click Score: 10    End of Session   Activity Tolerance: Patient limited by fatigue;Other (comment)(K low 3.1; no supplementation started) Patient left: in chair;with call bell/phone within reach;with chair alarm set   PT Visit Diagnosis: Other abnormalities of gait and mobility (R26.89);Repeated falls (R29.6);Muscle weakness (generalized) (M62.81);Unsteadiness on feet (R26.81);Pain Pain - Right/Left: Left Pain - part of body: Hip     Time:  1422(1452)-1452 PT Time Calculation (min) (ACUTE ONLY): 30 min  Charges:  $Therapeutic Exercise: 23-37 mins                      Larae Grooms, PTA 01/13/2018, 3:14 PM

## 2018-01-13 NOTE — Consult Note (Signed)
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 Patient applies neosporin to wound bed and covers with dry dressing.  Wound type: cancerous lesion Pressure Injury POA: NA Measurement: 4 cm x 3.6 cm with 25% devitalized tissue. 75% pale pink  nongranulating Due to odor and devitalized tissue, will begin three days of DAkins for debridement.  Wound bed:see above Drainage (amount, consistency, odor) minimal serosanguinous  Necrotic odor Periwound:intact Dressing procedure/placement/frequency: Cleanse right breast tumor with NS.  Apply Dakin's moist kerlix to wound bed.  Cover with ABD pad and tape.  Change daily  Will not follow at this time.  Please re-consult if needed.  Domenic Moras MSN, RN, FNP-BC CWON Wound, Ostomy, Continence Nurse Pager [phone]

## 2018-01-13 NOTE — Progress Notes (Signed)
Anticoagulation monitoring(Lovenox):  72yo  F ordered Lovenox 40 mg Q24h  Filed Weights   01/10/18 1547 01/10/18 2014  Weight: 100 lb (45.4 kg) 94 lb 14.4 oz (43 kg)   BMI 17.9   Lab Results  Component Value Date   CREATININE 0.51 01/13/2018   CREATININE 0.55 01/12/2018   CREATININE 0.52 01/11/2018   Estimated Creatinine Clearance: 42.5 mL/min (by C-G formula based on SCr of 0.51 mg/dL). Hemoglobin & Hematocrit     Component Value Date/Time   HGB 12.2 01/13/2018 0420   HCT 35.6 (L) 01/13/2018 0420     Per Protocol for Patient with estCrcl > 30 ml/min and Weightt < 45 kg in Female patient,  will transition to Lovenox 30 mg Q24h     Chinita Greenland PharmD Clinical Pharmacist 01/13/2018

## 2018-01-13 NOTE — Progress Notes (Signed)
PHARMACY NOTE:  ANTIMICROBIAL RENAL DOSAGE ADJUSTMENT  Current antimicrobial regimen includes a mismatch between antimicrobial dosage and estimated renal function.  As per policy approved by the Pharmacy & Therapeutics and Medical Executive Committees, the antimicrobial dosage will be adjusted accordingly.  Current antimicrobial dosage:  Levaquin 500 mg po q24h  Indication: poss early PNA, UTI  Renal Function: Estimated Creatinine Clearance: 42.5 mL/min (by C-G formula based on SCr of 0.51 mg/dL).  Antimicrobial dosage has been changed to:  Levaquin 250 mg q24h for Crcl < 50 ml/min  Additional comments:   Thank you for allowing pharmacy to be a part of this patient's care.  Andrya Roppolo A, [facility] 01/13/2018 9:34 AM

## 2018-01-13 NOTE — Progress Notes (Signed)
Clinical Social Worker (CSW) met with patient to discuss D/C plan. CSW explained that PT is recommending SNF. Patient is now agreeable to SNF search. FL2 complete and faxed out. CSW presented bed offers to patient. She chose [facility] because her daughter [name] lives in [place]. CSW made patient aware that Holland Falling will have to approve SNF. Per [facility] admissions coordinator at [facility] he will start [facility] authorization today. CSW made Gerald Stabs aware of patient's wound care needs on her right breast. CSW attempted to contact patient's daughter Health however she did not answer and a voicemail was left. MD and RN aware of above. CSW will continue to follow and assist as needed.   [employer], LCSW [phone]

## 2018-01-13 NOTE — Consult Note (Signed)
Consultation Note Date: 01/13/2018   Patient Name: Janet [name]  DOB: 04-22-1945  MRN: [id number]  Age / Sex: 72 y.o., female  PCP: [facility] Referring Physician: Gladstone Lighter, MD  Reason for Consultation: Establishing goals of care  HPI/Patient Profile: 72 y.o. female  with past medical history of untreated breast cancer, current tobacco smoker admitted on 01/10/2018 with fall r/t dizziness resulting in left hip fracture with surgical repair 01/11/18.   Clinical Assessment and Goals of Care: I met today with [name]. She is sitting up in recliner. Appears to be feeling well post-op with pain well controlled. She had a fall yesterday but no acute injury. Appetite is very poor. She is alert and oriented. Clear about her wishes for DNR. She tells me she did not have treatment for her breast cancer "they explained it to me but I didn't really understand" - she was at [facility] for this but I see no notes in Epic or CareEverywhere. She is not so happy about going to rehab but understands the importance and ultimately her goal is to return home and live as independently as possible.   We discussed Advance Directives and she is interested in discussing further. I provided her MOST form and explained this and gave her a Hard Choices booklet. She was reading MOST when I left her. She would like some time to look over these documents but did comment that she would not want a feeding tube. I will try and revisit tomorrow to further discuss her wishes. If she discharges I would recommend outpatient palliative to visit.   I did note suspicious lesion on frontal bone of head CT for possible mets. I did not discuss this with Ms. Brucker today. Plan to follow up to make sure this information is given to Ms. Labarbera appropriately before discharge.   Primary Decision Maker PATIENT    SUMMARY OF RECOMMENDATIONS     - DNR - Discussed Advance Directives  Code Status/Advance Care Planning:  DNR   Symptom Management:   Post-op hip pain managed with low dose Vicodin. LBM 10/21, 10/20.   No current symptoms needs.   Palliative Prophylaxis:   Bowel Regimen, Delirium Protocol and Frequent Pain Assessment  Psycho-social/Spiritual:   Desire for further Chaplaincy support:no  Additional Recommendations: Caregiving  Support/Resources  Prognosis:   Overall very poor given untreated breast cancer with possible mets and overall frailty.   Discharge Planning: [place] for rehab with Palliative care service follow-up      Primary Diagnoses: Present on Admission: . Hip fx ([place])   I have reviewed the medical record, interviewed the patient and family, and examined the patient. The following aspects are pertinent.  Past Medical History:  Diagnosis Date  . Cancer [facility])    Breast   Social History   Socioeconomic History  . Marital status: Divorced    Spouse name: Not on file  . Number of children: Not on file  . Years of education: Not on file  . Highest education  level: Not on file  Occupational History  . Not on file  Social Needs  . Financial resource strain: Not on file  . Food insecurity:    Worry: Not on file    Inability: Not on file  . Transportation needs:    Medical: Not on file    Non-medical: Not on file  Tobacco Use  . Smoking status: Current Every Day Smoker    Packs/day: 0.50    Types: Cigarettes  . Smokeless tobacco: Never Used  Substance and Sexual Activity  . Alcohol use: Not Currently  . Drug use: Never  . Sexual activity: Not Currently  Lifestyle  . Physical activity:    Days per week: Not on file    Minutes per session: Not on file  . Stress: Not on file  Relationships  . Social connections:    Talks on phone: Not on file    Gets together: Not on file    Attends religious service: Not on file    Active member of club or  organization: Not on file    Attends meetings of clubs or organizations: Not on file    Relationship status: Not on file  Other Topics Concern  . Not on file  Social History Narrative  . Not on file   History reviewed. No pertinent family history. Scheduled Meds: . amLODipine  2.5 mg Oral Daily  . chlorhexidine  1 application Topical Once  . docusate sodium  100 mg Oral BID  . enoxaparin (LOVENOX) injection  30 mg Subcutaneous Q24H  . feeding supplement (ENSURE ENLIVE)  237 mL Oral BID BM  . ipratropium-albuterol  3 mL Nebulization Once  . [START ON 01/14/2018] levofloxacin  250 mg Oral Daily  . metoprolol tartrate  25 mg Oral BID  . multivitamin with minerals  1 tablet Oral Daily  . nicotine  14 mg Transdermal Daily  . sodium hypochlorite   Irrigation Daily   Continuous Infusions: PRN Meds:.acetaminophen **OR** acetaminophen, bisacodyl, hydrALAZINE, HYDROcodone-acetaminophen, ipratropium-albuterol, metoCLOPramide **OR** metoCLOPramide (REGLAN) injection, morphine injection, ondansetron **OR** ondansetron (ZOFRAN) IV, ondansetron **OR** ondansetron (ZOFRAN) IV, polyethylene glycol Allergies  Allergen Reactions  . Lactose Intolerance (Gi) Other (See Comments)   Review of Systems  Constitutional: Positive for activity change and appetite change.  Neurological: Positive for weakness.    Physical Exam  Constitutional: She is oriented to person, place, and time. She appears well-developed. She appears cachectic. She appears ill.  Frail   Cardiovascular: Normal rate.  Pulmonary/Chest: No accessory muscle usage. No tachypnea. No respiratory distress.  Abdominal: Normal appearance.  Neurological: She is alert and oriented to person, place, and time.  Nursing note and vitals reviewed.   Vital Signs: BP (!) 159/75 (BP Location: Left Arm)   Pulse (!) 102   Temp 98.7 F (37.1 C) (Oral)   Resp 15   Ht 5' 1"  (1.549 m)   Wt 43 kg   SpO2 94%   BMI 17.93 kg/m  Pain Scale:  0-10 POSS *See Group Information*: 1-Acceptable,Awake and alert Pain Score: 5    SpO2: SpO2: 94 % O2 Device:SpO2: 94 % O2 Flow Rate: .O2 Flow Rate (L/min): 2 L/min  IO: Intake/output summary:   Intake/Output Summary (Last 24 hours) at 01/13/2018 0941 Last data filed at 01/12/2018 1727 Gross per 24 hour  Intake 360 ml  Output 400 ml  Net -40 ml    LBM: Last BM Date: 01/09/18 Baseline Weight: Weight: 45.4 kg Most recent weight: Weight: 43 kg  Palliative Assessment/Data: 50%     Time In: 1245 Time Out: 1320 Time Total: 35 min Greater than 50%  of this time was spent counseling and coordinating care related to the above assessment and plan.  Signed by: Vinie Sill, NP Palliative Medicine Team Pager # [phone] (M-F 8a-5p) Team Phone # [phone] (Nights/Weekends)

## 2018-01-13 NOTE — Progress Notes (Signed)
[place] at [place] NAME: Janet [name]    MR#:  [id number]  DATE OF BIRTH:  07/15/1945  SUBJECTIVE:  CHIEF COMPLAINT:   Chief Complaint  Patient presents with  . Fall   -Postop day 2 after left femoral neck fracture surgery.  Patient complains of pain in the left hip. -No complaints.  Had a fall while the aide was walking with her yesterday and hit her head.  REVIEW OF SYSTEMS:  Review of Systems  Constitutional: Negative for chills, fever and malaise/fatigue.  HENT: Negative for congestion, ear discharge, hearing loss and nosebleeds.   Eyes: Negative for blurred vision and double vision.  Respiratory: Negative for cough, shortness of breath and wheezing.        Dyspnea  Cardiovascular: Negative for chest pain and palpitations.  Gastrointestinal: Negative for abdominal pain, constipation, diarrhea, nausea and vomiting.  Genitourinary: Negative for dysuria and urgency.  Musculoskeletal: Positive for falls, joint pain and myalgias.  Neurological: Negative for dizziness, focal weakness, seizures, weakness and headaches.  Psychiatric/Behavioral: Negative for depression.    DRUG ALLERGIES:   Allergies  Allergen Reactions  . Lactose Intolerance (Gi) Other (See Comments)    VITALS:  Blood pressure (!) 124/55, pulse 84, temperature 98 F (36.7 C), temperature source Oral, resp. rate 15, height 5\' 1"  (1.549 m), weight 43 kg, SpO2 92 %.  PHYSICAL EXAMINATION:  Physical Exam  GENERAL:  72 y.o.-year-old thin built and ill nourished patient lying in the bed with no acute distress.  EYES: Pupils equal, round, reactive to light and accommodation. No scleral icterus. Extraocular muscles intact.  HEENT: Head atraumatic, normocephalic. Oropharynx and nasopharynx clear.  NECK:  Supple, no jugular venous distention. No thyroid enlargement, no tenderness.  Status post right mastectomy LUNGS: Normal breath sounds bilaterally, minimal  scattered wheezing noted, no rales,rhonchi or crepitation. No use of accessory muscles of respiration.  Decreased bibasilar breath sounds noted.   CARDIOVASCULAR: S1, S2 normal. No  rubs, or gallops.  2/6 systolic murmur is present ABDOMEN: Soft, nontender, nondistended. Bowel sounds present. No organomegaly or mass.  EXTREMITIES: No pedal edema, cyanosis, or clubbing.  Left hip dressing in place.  No drains noted. NEUROLOGIC: Cranial nerves II through XII are intact. Muscle strength 5/5 in all extremities except the left leg due to pain.  Sensation intact. Gait not checked.  PSYCHIATRIC: The patient is alert and oriented x 3.  SKIN: No obvious rash, lesion, or ulcer.    LABORATORY PANEL:   CBC Recent Labs  Lab 01/13/18 0420  WBC 11.8*  HGB 12.2  HCT 35.6*  PLT 265   ------------------------------------------------------------------------------------------------------------------  Chemistries  Recent Labs  Lab 01/11/18 0332  01/13/18 0420  NA 141   < > 138  K 4.0   < > 3.1*  CL 107   < > 105  CO2 27   < > 27  GLUCOSE 147*   < > 119*  BUN 11   < > 7*  CREATININE 0.52   < > 0.51  CALCIUM 8.9   < > 8.5*  MG 2.0  --   --    < > = values in this interval not displayed.   ------------------------------------------------------------------------------------------------------------------  Cardiac Enzymes Recent Labs  Lab 01/10/18 1551  TROPONINI <0.03   ------------------------------------------------------------------------------------------------------------------  RADIOLOGY:  Dg Chest 2 View  Result Date: 01/12/2018 CLINICAL DATA:  Status post ORIF. Fever and cough. Smoking history. EXAM: CHEST - 2 VIEW COMPARISON:  One-view chest x-ray  01/10/2018 FINDINGS: Heart size normal. Atherosclerotic changes are present at the aorta. Changes of COPD are again noted. There is subtle increased airspace disease in the right upper lobe. Early infection is not excluded. IMPRESSION: 1.  Subtle right upper lobe airspace disease raises possibility of early pneumonia. 2. Changes of COPD without other focal airspace disease. Electronically Signed   By: San Morelle M.D.   On: 01/12/2018 08:48   Ct Head Wo Contrast  Result Date: 01/12/2018 CLINICAL DATA:  Fall with head trauma.  Initial encounter. EXAM: CT HEAD WITHOUT CONTRAST TECHNIQUE: Contiguous axial images were obtained from the base of the skull through the vertex without intravenous contrast. COMPARISON:  None. FINDINGS: Brain: No evidence of acute infarction, hemorrhage, hydrocephalus, extra-axial collection or mass lesion/mass effect. 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 Ischemic gliosis in the deep cerebral white matter. Vascular: Atherosclerotic calcification Skull: History of breast cancer. There is an irregular sclerotic and mildly expansile lesion in the right frontal calvarium with indistinct zone of margination measuring 4.1 cm in diameter. Sinuses/Orbits: Negative IMPRESSION: 1. No acute finding. 2. Large sclerotic lesion in the right frontal bone suspicious for metastatic deposit in this patient with history of breast cancer. Please correlate with prior imaging. Bone scan could be obtained on an elective basis. 3. 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 Electronically Signed   By: Monte Fantasia M.D.   On: 01/12/2018 16:11   Dg Hip Unilat With Pelvis 2-3 Views Left  Result Date: 01/12/2018 CLINICAL DATA:  The patient underwent fixation of a left subcapital fracture 01/11/2018. The patient suffered a fall out of bed after surgery. Subsequent encounter. EXAM: DG HIP (WITH OR WITHOUT PELVIS) 2-3V LEFT COMPARISON:  Plain films left hip 01/10/2018. Intraoperative imaging 01/11/2018. FINDINGS: No acute bony or joint abnormality is identified. Three screws fixing a subcapital fracture of the left hip are unchanged. Position and alignment of the fracture  also unchanged. Bones appear osteopenic. IMPRESSION: No acute abnormality. No change in the appearance of the left hip after ORIF. Electronically Signed   By: Inge Rise M.D.   On: 01/12/2018 16:31    EKG:   Orders placed or performed during the hospital encounter of 01/10/18  . ED EKG  . ED EKG    ASSESSMENT AND PLAN:   72 year old female with past medical history significant for breast cancer history, ongoing smoking presents to hospital secondary to fall and left subcapital femoral fracture.  1.  Left femoral neck fracture-secondary to mechanical fall. -Appreciate orthopedics consult.  Postop day 2 today -  pain control.  Physical therapy - needs SNF -Fall while in the hospital and hit her head.  CT negative for any acute findings.  Also hip x-ray was normal  2.  Tobacco use disorder-on nicotine patch -Likely has underlying COPD from smoking.  Wheezing this morning.   PRN duo nebs  3.  History of breast cancer-patient is status post right mastectomy -No outpatient follow-ups with oncology noted. -On the CT of her head it shows a large sclerotic lesion in the right frontal lobe-follow-up with oncology as outpatient  4.  DVT prophylaxis-Lovenox   5. HTN- peri op metoprolol started- patient not on meds at home for the same. Monitor -Also on low-dose Norvasc  6.  UTI and right upper lobe pneumonia-no further fevers.  Started on Levaquin.  Urine cultures added on.  7.  Hypokalemia-being replaced    All the records are reviewed and case discussed  with Care Management/Social Workerr. Management plans discussed with the patient, family and they are in agreement.  CODE STATUS: DNR  TOTAL TIME TAKING CARE OF THIS PATIENT: 38 minutes.   POSSIBLE D/C IN 1-2 DAYS, DEPENDING ON CLINICAL CONDITION.   Gladstone Lighter M.D on 01/13/2018 at 2:32 PM  Between 7am to 6pm - Pager - [phone]  After 6pm go to [email or web address] - password [email or web address]    Office  [phone]  CC: Primary care physician; [employer]

## 2018-01-14 DIAGNOSIS — M899 Disorder of bone, unspecified: Secondary | ICD-10-CM

## 2018-01-14 LAB — BASIC METABOLIC PANEL
Anion gap: 7 (ref 5–15)
BUN: 10 mg/dL (ref 8–23)
CO2: 26 mmol/L (ref 22–32)
Calcium: 8.7 mg/dL — ABNORMAL LOW (ref 8.9–10.3)
Chloride: 106 mmol/L (ref 98–111)
Creatinine, Ser: 0.49 mg/dL (ref 0.44–1.00)
GFR calc Af Amer: >60 mL/min (ref >60)
GFR calc non Af Amer: >60 mL/min (ref >60)
Glucose, Bld: 117 mg/dL — ABNORMAL HIGH (ref 70–99)
Potassium: 4.3 mmol/L (ref 3.5–5.1)
Sodium: 139 mmol/L (ref 135–145)

## 2018-01-14 MED ORDER — SODIUM HYPOCHLORITE 0.125 % SOLUTION
0 days
Start: 2018-01-14 — End: ?

## 2018-01-14 MED ORDER — IPRATROPIUM 0.5 MG-ALBUTEROL 3 MG (2.5 MG BASE)/3 ML NEBULIZATION SOLN
RESPIRATORY_TRACT | 0 days
Start: 2018-01-14 — End: 2019-07-24

## 2018-01-14 MED ORDER — HYDROCODONE 5 MG-ACETAMINOPHEN 325 MG TABLET
ORAL | 0 days
Start: 2018-01-14 — End: 2019-07-24

## 2018-01-14 MED ORDER — METOPROLOL TARTRATE 25 MG TABLET
ORAL | 0 days
Start: 2018-01-14 — End: 2019-07-24

## 2018-01-14 MED ORDER — ENSURE ENLIVE ORAL
ORAL | 0 days
Start: 2018-01-14 — End: ?

## 2018-01-14 MED ORDER — ACETAMINOPHEN 325 MG TABLET
ORAL | 0 days
Start: 2018-01-14 — End: ?

## 2018-01-14 MED ORDER — ENOXAPARIN 30 MG/0.3 ML SUBCUTANEOUS SYRINGE
SUBCUTANEOUS | 0 days
Start: 2018-01-14 — End: 2019-07-24

## 2018-01-14 MED ORDER — DOCUSATE SODIUM 100 MG CAPSULE
ORAL | 0 days
Start: 2018-01-14 — End: 2019-07-24

## 2018-01-14 MED ORDER — POLYETHYLENE GLYCOL 3350 17 GRAM/DOSE ORAL POWDER
ORAL | 0 days
Start: 2018-01-14 — End: 2019-07-24

## 2018-01-14 MED ORDER — NICOTINE 14 MG/24HR TD PT24: 14.0000 mg | MEDICATED_PATCH | Freq: Every day | TRANSDERMAL | 0 refills | Status: AC

## 2018-01-14 MED ORDER — DOCUSATE SODIUM 100 MG PO CAPS: 100.0000 mg | ORAL_CAPSULE | Freq: Two times a day (BID) | ORAL | 0 refills | Status: AC

## 2018-01-14 MED ORDER — HYDROCODONE-ACETAMINOPHEN 5-325 MG PO TABS: 1.0000 | ORAL_TABLET | Freq: Four times a day (QID) | ORAL | 0 refills | Status: AC | PRN

## 2018-01-14 MED ORDER — METOPROLOL TARTRATE 25 MG PO TABS: 25.0000 mg | ORAL_TABLET | Freq: Two times a day (BID) | ORAL | 2 refills | Status: AC

## 2018-01-14 MED ORDER — LEVOFLOXACIN 250 MG PO TABS: 250.0000 mg | ORAL_TABLET | Freq: Every day | ORAL | 0 refills | Status: AC

## 2018-01-14 MED ORDER — DAKINS (1/4 STRENGTH) 0.125 % EX SOLN: Freq: Every day | CUTANEOUS | 0 refills | Status: AC

## 2018-01-14 MED ORDER — ENOXAPARIN SODIUM 30 MG/0.3ML ~~LOC~~ SOLN: 30.0000 mg | SUBCUTANEOUS | 0 refills | Status: AC

## 2018-01-14 MED ORDER — AMLODIPINE BESYLATE 5 MG PO TABS: 5.0000 mg | ORAL_TABLET | Freq: Every day | ORAL | 2 refills | Status: AC

## 2018-01-14 MED ORDER — ENSURE ENLIVE PO LIQD: 237.0000 mL | Freq: Two times a day (BID) | ORAL | 12 refills | Status: AC

## 2018-01-14 MED ORDER — IPRATROPIUM-ALBUTEROL 0.5-2.5 (3) MG/3ML IN SOLN: 3.0000 mL | RESPIRATORY_TRACT | Status: AC | PRN

## 2018-01-14 MED ORDER — POLYETHYLENE GLYCOL 3350 17 G PO PACK: 17.0000 g | PACK | Freq: Every day | ORAL | 0 refills | Status: AC | PRN

## 2018-01-14 MED ORDER — ACETAMINOPHEN 325 MG PO TABS: 650.0000 mg | ORAL_TABLET | Freq: Four times a day (QID) | ORAL | Status: AC | PRN

## 2018-01-14 NOTE — Progress Notes (Signed)
EMS here to transport pt. 

## 2018-01-14 NOTE — Progress Notes (Signed)
Palliative:  I met again today with Ms. Bowermaster and daughter, [name], is at bedside. Ms. Schrimpf denies pain but appears a little restless but perhaps from our conversation. She does get SOB with conversation and eating at times. I clarified with [name] that her mother is not receiving care from oncologist currently. [name] says that her mother opted out of treatment and did not want to pursue any further testing or interventions. Ms. Glendinning is not fond of doctors or hospitals. She is anticipating her upcoming discharge with hopes of returning home soon.   Given that Ms. Raine does not really follow up with her doctors from her own choice. I did explain to them that CT head showed a suspicious lesion on frontal bone which could indicate spread of cancer. They understand. Still no desire to pursue further treatment or diagnostic procedures. I educated them for outpatient palliative (at SNF and at home) to help them navigate especially with worsening health or symptoms. They are both interested in outpatient palliative care to help support them and help them prevent further hospitalizations. They believe they have completed HCPOA and potentially Living Will. [name] was educated about MOST and Hard Choices which her mother still had at bedside. All questions/concerns addressed. Emotional support provided.   Exam: Alert, oriented. Slightly SOB.   25 min  Vinie Sill, NP Palliative Medicine Team Pager # [phone] (M-F 8a-5p) Team Phone # [phone] (Nights/Weekends)

## 2018-01-14 NOTE — Progress Notes (Signed)
Clinical Education officer, museum (CSW) received a call back from patient's daughter [name] this morning and made her aware that plan is for patient to D/C to [facility] pending [facility] SNF authorization. Daughter is in agreement with plan. Per [facility] admissions coordinator at [facility] authorization is still pending.   [employer], LCSW [phone]

## 2018-01-14 NOTE — Discharge Summary (Signed)
[facility] [place] NAME: Janet [name]    MR#:  [id number]  DATE OF BIRTH:  01-29-1946  DATE OF ADMISSION:  01/10/2018   ADMITTING PHYSICIAN: Gorden Harms, MD  DATE OF DISCHARGE:  01/14/18  PRIMARY CARE PHYSICIAN: [facility] [place]   ADMISSION DIAGNOSIS:   Near syncope [R55] Closed fracture of neck of left femur, initial encounter ([place]) [S72.002A]  DISCHARGE DIAGNOSIS:   Active Problems:   Hip fx (HCC)   Protein-calorie malnutrition, severe   Advance care planning   Goals of care, counseling/discussion   Palliative care encounter   SECONDARY DIAGNOSIS:   Past Medical History:  Diagnosis Date  . Cancer [facility])    Breast    HOSPITAL COURSE:   72 year old female with past medical history significant for breast cancer history, ongoing smoking presents to hospital secondary to fall and left subcapital femoral fracture.  1.  Left femoral neck fracture-secondary to mechanical fall. -Appreciate orthopedics consult.  Postop day 3 today -  pain control.  Physical therapy - needs SNF -Fall while in the hospital and hit her head.  CT negative for any acute findings.  Also hip x-ray was normal - working well with PT today  2.  Tobacco use disorder-on nicotine patch -Likely has underlying COPD from smoking.    PRN duo nebs  3.  History of breast cancer-patient is status post right mastectomy -No outpatient follow-ups with oncology noted. -On the CT of her head it shows a large sclerotic lesion in the right frontal lobe-follow-up with oncology as outpatient  4.  DVT prophylaxis-Lovenox for 2 weeks  5. HTN-  Not on home meds- started on norvasc and metoprolol  6.  UTI and right upper lobe pneumonia-no further fevers.  on Levaquin.  Urine cultures pending  7.  Hypokalemia-replaced  Will be going to rehab today   DISCHARGE CONDITIONS:   Guarded  CONSULTS OBTAINED:   Treatment Team:  Lovell [name], MD  DRUG ALLERGIES:   Allergies  Allergen Reactions  . Lactose Intolerance (Gi) Other (See Comments)   DISCHARGE MEDICATIONS:   Allergies as of 01/14/2018      Reactions   Lactose Intolerance (gi) Other (See Comments)      Medication List    TAKE these medications   acetaminophen 325 MG tablet Commonly known as:  TYLENOL Take 2 tablets (650 mg total) by mouth every 6 (six) hours as needed for mild pain (or Fever >/= 101).   amLODipine 5 MG tablet Commonly known as:  NORVASC Take 1 tablet (5 mg total) by mouth daily. Start taking on:  01/15/2018   docusate sodium 100 MG capsule Commonly known as:  COLACE Take 1 capsule (100 mg total) by mouth 2 (two) times daily.   enoxaparin 30 MG/0.3ML injection Commonly known as:  LOVENOX Inject 0.3 mLs (30 mg total) into the skin daily for 14 days.   feeding supplement (ENSURE ENLIVE) Liqd Take 237 mLs by mouth 2 (two) times daily between meals.   HYDROcodone-acetaminophen 5-325 MG tablet Commonly known as:  NORCO/VICODIN Take 1-2 tablets by mouth every 6 (six) hours as needed for moderate pain.   ipratropium-albuterol 0.5-2.5 (3) MG/3ML Soln Commonly known as:  DUONEB Take 3 mLs by nebulization every 4 (four) hours as needed.   levofloxacin 250 MG tablet Commonly known as:  LEVAQUIN Take 1 tablet (250 mg total) by mouth daily for 5 days. Start taking on:  01/15/2018   metoprolol  tartrate 25 MG tablet Commonly known as:  LOPRESSOR Take 1 tablet (25 mg total) by mouth 2 (two) times daily.   nicotine 14 mg/24hr patch Commonly known as:  NICODERM CQ - dosed in mg/24 hours Place 1 patch (14 mg total) onto the skin daily. Start taking on:  01/15/2018   polyethylene glycol packet Commonly known as:  MIRALAX / GLYCOLAX Take 17 g by mouth daily as needed for mild constipation.   sodium hypochlorite 0.125 % Soln Commonly known as:  DAKIN'S 1/4 STRENGTH Irrigate with as directed daily.        DISCHARGE  INSTRUCTIONS:   1. PCP f/u in 1-2 weeks 2. Orthopedics f/u in 1-2 weeks 3. Cancer center f/u in 2 weeks   DIET:   Cardiac diet  ACTIVITY:   Activity as tolerated  OXYGEN:   Home Oxygen: No.  Oxygen Delivery: room air  DISCHARGE LOCATION:   nursing home   If you experience worsening of your admission symptoms, develop shortness of breath, life threatening emergency, suicidal or homicidal thoughts you must seek medical attention immediately by calling 911 or calling your MD immediately  if symptoms less severe.  You Must read complete instructions/literature along with all the possible adverse reactions/side effects for all the Medicines you take and that have been prescribed to you. Take any new Medicines after you have completely understood and accpet all the possible adverse reactions/side effects.   Please note  You were cared for by a hospitalist during your hospital stay. If you have any questions about your discharge medications or the care you received while you were in the hospital after you are discharged, you can call the unit and asked to speak with the hospitalist on call if the hospitalist that took care of you is not available. Once you are discharged, your primary care physician will handle any further medical issues. Please note that NO REFILLS for any discharge medications will be authorized once you are discharged, as it is imperative that you return to your primary care physician (or establish a relationship with a primary care physician if you do not have one) for your aftercare needs so that they can reassess your need for medications and monitor your lab values.    On the day of Discharge:  VITAL SIGNS:   Blood pressure (!) 163/73, pulse (!) 105, temperature 98.8 F (37.1 C), temperature source Oral, resp. rate 18, height 5\' 1"  (1.549 m), weight 43 kg, SpO2 92 %.  PHYSICAL EXAMINATION:    GENERAL:  72 y.o.-year-old thin built and ill nourished patient  lying in the bed with no acute distress.  EYES: Pupils equal, round, reactive to light and accommodation. No scleral icterus. Extraocular muscles intact.  HEENT: Head atraumatic, normocephalic. Oropharynx and nasopharynx clear.  NECK:  Supple, no jugular venous distention. No thyroid enlargement, no tenderness.  Status post right mastectomy LUNGS: Normal breath sounds bilaterally, minimal scattered wheezing noted, no rales,rhonchi or crepitation. No use of accessory muscles of respiration.  Decreased bibasilar breath sounds noted.   CARDIOVASCULAR: S1, S2 normal. No  rubs, or gallops.  2/6 systolic murmur is present ABDOMEN: Soft, nontender, nondistended. Bowel sounds present. No organomegaly or mass.  EXTREMITIES: No pedal edema, cyanosis, or clubbing.  Left hip dressing in place.  No drains noted. NEUROLOGIC: Cranial nerves II through XII are intact. Muscle strength 5/5 in all extremities except the left leg due to pain.  Sensation intact. Gait not checked.  PSYCHIATRIC: The patient is alert and oriented  x 3.  SKIN: No obvious rash, lesion, or ulcer.    DATA REVIEW:   CBC Recent Labs  Lab 01/13/18 0420  WBC 11.8*  HGB 12.2  HCT 35.6*  PLT 265    Chemistries  Recent Labs  Lab 01/11/18 0332  01/14/18 0327  NA 141   < > 139  K 4.0   < > 4.3  CL 107   < > 106  CO2 27   < > 26  GLUCOSE 147*   < > 117*  BUN 11   < > 10  CREATININE 0.52   < > 0.49  CALCIUM 8.9   < > 8.7*  MG 2.0  --   --    < > = values in this interval not displayed.     Microbiology Results  Results for orders placed or performed during the hospital encounter of 01/10/18  Surgical pcr screen     Status: None   Collection Time: 01/11/18  9:01 AM  Result Value Ref Range Status   MRSA, PCR NEGATIVE NEGATIVE Final   Staphylococcus aureus NEGATIVE NEGATIVE Final    Comment: (NOTE) The Xpert SA Assay (FDA approved for NASAL specimens in patients 1 years of age and older), is one component of a  comprehensive surveillance program. It is not intended to diagnose infection nor to guide or monitor treatment. Performed at [facility], [address] [place], [place] [address]   Urine Culture     Status: None (Preliminary result)   Collection Time: 01/13/18  2:38 PM  Result Value Ref Range Status   Specimen Description   Final    URINE, RANDOM Performed at [facility], [address], [place], [place] [address]    Special Requests   Final    NONE Performed at [facility], [address], [place], [place] [address]    Culture   Final    CULTURE REINCUBATED FOR BETTER GROWTH Performed at [facility], [place] [address] [place], [place] [address]    Report Status PENDING  Incomplete    RADIOLOGY:  No results found.   Management plans discussed with the patient, family and they are in agreement.  CODE STATUS:     Code Status Orders  (From admission, onward)         Start     Ordered   01/10/18 2012  Do not attempt resuscitation (DNR)  Continuous    Question Answer Comment  In the event of cardiac or respiratory ARREST Do not call a "code blue"   In the event of cardiac or respiratory ARREST Do not perform Intubation, CPR, defibrillation or ACLS   In the event of cardiac or respiratory ARREST Use medication by any route, position, wound care, and other measures to relive pain and suffering. May use oxygen, suction and manual treatment of airway obstruction as needed for comfort.   Comments Nurse may pronounce      01/10/18 2011        Code Status History    This patient has a current code status but no historical code status.      TOTAL TIME TAKING CARE OF THIS PATIENT: 38 minutes.    Gladstone Lighter M.D on 01/14/2018 at 12:57 PM  Between 7am to 6pm - Pager - [phone]  After 6pm go to [email or web address] - Technical brewer [facility]  Office  [phone]  CC: Primary care physician;  [employer], [employer]  Note: This dictation was prepared with Dragon dictation along with smaller phrase technology. Any transcriptional errors that result from this process are unintentional.

## 2018-01-14 NOTE — Progress Notes (Signed)
Physical Therapy Treatment Patient Details Name: Janet [name] MRN: [id number] DOB: 29-Sep-1945 Today's Date: 01/14/2018    History of Present Illness Patient is a pleasant 72 year old female who was admitted for hip fx from fall and is now s/p ORIF. She has a history significant for untreated breast cancer. Patient reports multiple falls and lives alone. She does not use an assistive device.     PT Comments    Pt sliding out of chair upon arrival.  Bottom sitting on leg rests with hip/knee flexion in hook lying position.  Pt assisted to a a proper position.  Stated she was not comfortable in chair but did not call for assist.  Chair alarm was on.  Pt stood with min assist and transferred back to bed with min a.  She declined further session at this time.  Pt with no increased pain with mobility from previous fall x 2 days ago with staff.     Follow Up Recommendations  SNF     Equipment Recommendations  Rolling walker with 5" wheels    Recommendations for Other Services       Precautions / Restrictions Precautions Precautions: Fall Precaution Comments: L ORIF Restrictions Weight Bearing Restrictions: Yes LLE Weight Bearing: Weight bearing as tolerated    Mobility  Bed Mobility Overal bed mobility: Needs Assistance Bed Mobility: Sit to Supine     Supine to sit: Min assist Sit to supine: Min assist      Transfers Overall transfer level: Needs assistance Equipment used: Rolling walker (2 wheeled) Transfers: Sit to/from Stand Sit to Stand: Min assist            Ambulation/Gait Ambulation/Gait assistance: Min Web designer (Feet): 3 Feet Assistive device: Rolling walker (2 wheeled) Gait Pattern/deviations: Step-to pattern Gait velocity: decreased   General Gait Details: 12'x 2 to/from bathroom.  Standard walker used as it was in room but she will likely do better with rolling walker.   Stairs             Wheelchair Mobility    Modified  Rankin (Stroke Patients Only)       Balance Overall balance assessment: Needs assistance Sitting-balance support: Feet supported Sitting balance-Leahy Scale: Fair     Standing balance support: Bilateral upper extremity supported Standing balance-Leahy Scale: Poor Standing balance comment: Limited weight bearing through LLE, Requires use of a walker and CGA                             Cognition Arousal/Alertness: Awake/alert Behavior During Therapy: WFL for tasks assessed/performed Overall Cognitive Status: Within Functional Limits for tasks assessed                                        Exercises Other Exercises Other Exercises: LLE ankle pumps, heel slides, ab/add and SLR AAROM x 10    General Comments        Pertinent Vitals/Pain Pain Assessment: 0-10 Pain Score: 4  Pain Location: L hip Pain Descriptors / Indicators: Operative site guarding;Discomfort;Sore Pain Intervention(s): Limited activity within patient's tolerance;Repositioned    Home Living                      Prior Function            PT Goals (current goals can now be found in  the care plan section) Progress towards PT goals: Progressing toward goals    Frequency    BID      PT Plan Current plan remains appropriate    Co-evaluation              AM-PAC PT "6 Clicks" Daily Activity  Outcome Measure  Difficulty turning over in bed (including adjusting bedclothes, sheets and blankets)?: None Difficulty moving from lying on back to sitting on the side of the bed? : None Difficulty sitting down on and standing up from a chair with arms (e.g., wheelchair, bedside commode, etc,.)?: Unable Help needed moving to and from a bed to chair (including a wheelchair)?: A Little Help needed walking in hospital room?: A Little Help needed climbing 3-5 steps with a railing? : Total 6 Click Score: 16    End of Session Equipment Utilized During Treatment: Gait  belt Activity Tolerance: Patient limited by fatigue Patient left: in bed;with call bell/phone within reach;with bed alarm set Nurse Communication: Other (comment) Pain - Right/Left: Left Pain - part of body: Hip     Time: 1150-1202 PT Time Calculation (min) (ACUTE ONLY): 12 min  Charges:  $Gait Training: 8-22 mins $Therapeutic Activity: 8-22 mins                    Chesley Noon, PTA 01/14/18, 12:13 PM

## 2018-01-14 NOTE — Progress Notes (Signed)
Patient is medically stable for D/C to [facility] today. Per [facility] admissions coordinator at [facility] authorization has been received and patient can come today. RN will call report and arrange EMS for transport. Clinical Education officer, museum (CSW) sent D/C orders to [facility] via [place] today. Patient and her daughter [name] are aware of above. Please reconsult if future social work needs arise. CSW signing off.   [employer], LCSW [phone]

## 2018-01-14 NOTE — Clinical Social Work Placement (Signed)
   CLINICAL SOCIAL WORK PLACEMENT  NOTE  Date:  01/14/2018  Patient Details  Name: Janet [name] MRN: [id number] Date of Birth: 08/17/1945  Clinical Social Work is seeking post-discharge placement for this patient at the [place] level of care (*CSW will initial, date and re-position this form in  chart as items are completed):  Yes   Patient/family provided with [place] Work Department's list of facilities offering this level of care within the geographic area requested by the patient (or if unable, by the patient's family).  Yes   Patient/family informed of their freedom to choose among providers that offer the needed level of care, that participate in Medicare, Medicaid or managed care program needed by the patient, have an available bed and are willing to accept the patient.  Yes   Patient/family informed of [facility]'s ownership interest in [facility] and [facility], as well as of the fact that they are under no obligation to receive care at these facilities.  PASRR submitted to EDS on 01/12/18     PASRR number received on 01/12/18     Existing PASRR number confirmed on       FL2 transmitted to all facilities in geographic area requested by pt/family on 01/12/18     FL2 transmitted to all facilities within larger geographic area on       Patient informed that his/her managed care company has contracts with or will negotiate with certain facilities, including the following:        Yes   Patient/family informed of bed offers received.  Patient chooses bed at [facility] )     Physician recommends and patient chooses bed at      Patient to be transferred to [facility] ) on 01/14/18.  Patient to be transferred to facility by [facility] )     Patient family notified on 01/14/18 of transfer.  Name of family member notified:  (Patient's daughter [name] is aware of D/C today. )      PHYSICIAN       Additional Comment:    _______________________________________________ Felice Deem, Veronia Beets, LCSW 01/14/2018, 2:31 PM

## 2018-01-14 NOTE — Progress Notes (Signed)
Report given to Joellen Jersey, Therapist, sports at [facility]. EMS called for  transportation.

## 2018-01-14 NOTE — Progress Notes (Signed)
Physical Therapy Treatment Patient Details Name: Janet [name] MRN: [id number] DOB: 06-03-1945 Today's Date: 01/14/2018    History of Present Illness Patient is a pleasant 72 year old female who was admitted for hip fx from fall and is now s/p ORIF. She has a history significant for untreated breast cancer. Patient reports multiple falls and lives alone. She does not use an assistive device.     PT Comments    Participated in exercises as described below.  To edge of bed with min guard and good effort.  Once sitting, able to sit without support.  Stood with min a x 1 and was hesitant to walk but agreed with encouragement and was able to walk to/from bathroom to void with walker and min assist.  She used a stnd walker as it was in room but I anticipate her to do better with rolling walker.  Gait with overall weakness and hesitation was noted.   O2 sats remained in low 90's on room air but HR did increase to 133 following gait.  RN notified.    Follow Up Recommendations  SNF     Equipment Recommendations  Rolling walker with 5" wheels    Recommendations for Other Services       Precautions / Restrictions Restrictions Weight Bearing Restrictions: Yes LLE Weight Bearing: Weight bearing as tolerated    Mobility  Bed Mobility Overal bed mobility: Needs Assistance Bed Mobility: Supine to Sit     Supine to sit: Min assist        Transfers Overall transfer level: Needs assistance   Transfers: Sit to/from Stand Sit to Stand: Min assist            Ambulation/Gait Ambulation/Gait assistance: Min assist Gait Distance (Feet): 12 Feet Assistive device: Standard walker Gait Pattern/deviations: Step-to pattern Gait velocity: decreased   General Gait Details: 12'x 2 to/from bathroom.  Standard walker used as it was in room but she will likely do better with rolling walker.   Stairs             Wheelchair Mobility    Modified Rankin (Stroke Patients Only)        Balance Overall balance assessment: Needs assistance Sitting-balance support: Feet supported Sitting balance-Leahy Scale: Fair     Standing balance support: Bilateral upper extremity supported Standing balance-Leahy Scale: Fair                              Cognition Arousal/Alertness: Awake/alert Behavior During Therapy: WFL for tasks assessed/performed Overall Cognitive Status: Within Functional Limits for tasks assessed                                        Exercises Other Exercises Other Exercises: LLE ankle pumps, heel slides, ab/add and SLR AAROM x 10    General Comments        Pertinent Vitals/Pain Pain Assessment: 0-10 Pain Score: 4  Pain Location: L hip Pain Descriptors / Indicators: Operative site guarding;Discomfort;Sore Pain Intervention(s): Limited activity within patient's tolerance;Monitored during session    Home Living                      Prior Function            PT Goals (current goals can now be found in the care plan section) Progress towards PT goals:  Progressing toward goals    Frequency    BID      PT Plan Current plan remains appropriate    Co-evaluation              AM-PAC PT "6 Clicks" Daily Activity  Outcome Measure  Difficulty turning over in bed (including adjusting bedclothes, sheets and blankets)?: None Difficulty moving from lying on back to sitting on the side of the bed? : None Difficulty sitting down on and standing up from a chair with arms (e.g., wheelchair, bedside commode, etc,.)?: Unable Help needed moving to and from a bed to chair (including a wheelchair)?: A Little Help needed walking in hospital room?: A Little Help needed climbing 3-5 steps with a railing? : Total 6 Click Score: 16    End of Session Equipment Utilized During Treatment: Gait belt Activity Tolerance: Patient limited by fatigue Patient left: in chair;with call bell/phone within reach;with  chair alarm set Nurse Communication: Other (comment) Pain - Right/Left: Left Pain - part of body: Hip     Time: 9150-5697 PT Time Calculation (min) (ACUTE ONLY): 21 min  Charges:  $Gait Training: 8-22 mins                     {Hanz Winterhalter, PTA 01/14/18, 11:32 AM

## 2018-01-15 LAB — URINE CULTURE

## 2018-01-15 MED ORDER — AMLODIPINE 5 MG TABLET
ORAL | 0 days
Start: 2018-01-15 — End: 2019-07-24

## 2018-01-15 MED ORDER — NICOTINE 14 MG/24 HR DAILY TRANSDERMAL PATCH
TRANSDERMAL | 0 days
Start: 2018-01-15 — End: 2019-07-24

## 2019-06-09 DIAGNOSIS — C50611 Malignant neoplasm of axillary tail of right female breast: Principal | ICD-10-CM

## 2019-06-09 DIAGNOSIS — C50919 Malignant neoplasm of unspecified site of unspecified female breast: Principal | ICD-10-CM

## 2019-06-15 DIAGNOSIS — C50919 Malignant neoplasm of unspecified site of unspecified female breast: Principal | ICD-10-CM

## 2019-06-17 ENCOUNTER — Emergency Department: Payer: Medicare HMO

## 2019-06-17 ENCOUNTER — Encounter: Payer: Self-pay | Admitting: Emergency Medicine

## 2019-06-17 ENCOUNTER — Emergency Department
Admission: EM | Admit: 2019-06-17 | Discharge: 2019-06-17 | Disposition: A | Discharge status: Home / Self Care | Payer: Medicare HMO | Attending: Emergency Medicine | Admitting: Emergency Medicine

## 2019-06-17 ENCOUNTER — Other Ambulatory Visit: Payer: Self-pay

## 2019-06-17 DIAGNOSIS — F17210 Nicotine dependence, cigarettes, uncomplicated: Secondary | ICD-10-CM | POA: Insufficient documentation

## 2019-06-17 DIAGNOSIS — J449 Chronic obstructive pulmonary disease, unspecified: Secondary | ICD-10-CM | POA: Diagnosis not present

## 2019-06-17 DIAGNOSIS — C801 Malignant (primary) neoplasm, unspecified: Secondary | ICD-10-CM

## 2019-06-17 DIAGNOSIS — Z79899 Other long term (current) drug therapy: Secondary | ICD-10-CM | POA: Diagnosis not present

## 2019-06-17 DIAGNOSIS — C50919 Malignant neoplasm of unspecified site of unspecified female breast: Secondary | ICD-10-CM | POA: Diagnosis not present

## 2019-06-17 DIAGNOSIS — L989 Disorder of the skin and subcutaneous tissue, unspecified: Secondary | ICD-10-CM | POA: Diagnosis present

## 2019-06-17 LAB — COMPREHENSIVE METABOLIC PANEL
ALT: 11 U/L (ref 0–44)
AST: 24 U/L (ref 15–41)
Albumin: 3.9 g/dL (ref 3.5–5.0)
Alkaline Phosphatase: 104 U/L (ref 38–126)
Anion gap: 10 (ref 5–15)
BUN: 9 mg/dL (ref 8–23)
CO2: 27 mmol/L (ref 22–32)
Calcium: 8.6 mg/dL — ABNORMAL LOW (ref 8.9–10.3)
Chloride: 104 mmol/L (ref 98–111)
Creatinine, Ser: 0.65 mg/dL (ref 0.44–1.00)
GFR calc Af Amer: >60 mL/min (ref >60)
GFR calc non Af Amer: >60 mL/min (ref >60)
Glucose, Bld: 129 mg/dL — ABNORMAL HIGH (ref 70–99)
Potassium: 3.4 mmol/L — ABNORMAL LOW (ref 3.5–5.1)
Sodium: 141 mmol/L (ref 135–145)
Total Bilirubin: 0.5 mg/dL (ref 0.3–1.2)
Total Protein: 7.6 g/dL (ref 6.5–8.1)

## 2019-06-17 LAB — CBC WITH DIFFERENTIAL/PLATELET
Abs Immature Granulocytes: 0.01 10*3/uL (ref 0.00–0.07)
Basophils Absolute: 0.1 10*3/uL (ref 0.0–0.1)
Basophils Relative: 1 %
Eosinophils Absolute: 0.1 10*3/uL (ref 0.0–0.5)
Eosinophils Relative: 1 %
HCT: 38.3 % (ref 36.0–46.0)
Hemoglobin: 12.7 g/dL (ref 12.0–15.0)
Immature Granulocytes: 0 %
Lymphocytes Relative: 42 %
Lymphs Abs: 3.4 10*3/uL (ref 0.7–4.0)
MCH: 31.9 pg (ref 26.0–34.0)
MCHC: 33.2 g/dL (ref 30.0–36.0)
MCV: 96.2 fL (ref 80.0–100.0)
Monocytes Absolute: 0.5 10*3/uL (ref 0.1–1.0)
Monocytes Relative: 6 %
Neutro Abs: 4 10*3/uL (ref 1.7–7.7)
Neutrophils Relative %: 50 %
Platelets: 403 10*3/uL — ABNORMAL HIGH (ref 150–400)
RBC: 3.98 MIL/uL (ref 3.87–5.11)
RDW: 13.3 % (ref 11.5–15.5)
WBC: 8 10*3/uL (ref 4.0–10.5)
nRBC: 0 % (ref 0.0–0.2)

## 2019-06-17 LAB — LACTIC ACID, PLASMA: Lactic Acid, Venous: 1 mmol/L (ref 0.5–1.9)

## 2019-06-17 MED ORDER — SODIUM CHLORIDE 0.9% FLUSH: 3.0000 mL | Freq: Once | INTRAVENOUS | Status: DC

## 2019-06-17 NOTE — Discharge Instructions (Addendum)
Please seek medical attention for any high fevers, chest pain, shortness of breath, change in behavior, persistent vomiting, bloody stool or any other new or concerning symptoms.  

## 2019-06-17 NOTE — ED Notes (Signed)
See triage note, pt to ED for "infection on chest". Reports breast cancer pt since 2015, denies surgeries or treatments.  Wound noted to upper chest, reddened, with yellow/green drainage.

## 2019-06-17 NOTE — ED Provider Notes (Signed)
[facility] Emergency Department Provider Note   ____________________________________________   I have reviewed the triage vital signs and the nursing notes.   HISTORY  Chief Complaint Wound   History limited by: Not Limited   HPI Janet [name] is a 74 y.o. female who presents to the emergency department today because the manager of her living facility thought she should be evaluated.  The patient states she has had a wound on her chest for months if not years.  She does have a history of breast cancer but stopped going to receive treatments or seeing her oncologist roughly 5 years ago.  The patient states that the wound does occasionally leak fluid.  She denies any new symptoms today.  She states that her manager was concerned for possible infection.   Records reviewed. Per medical record review patient has a history of breast cancer, per wound nurse note lesion to right breast was 4x3.6 cm on 01/13/2018  Past Medical History:  Diagnosis Date  . Cancer [facility])    Breast    Patient Active Problem List   Diagnosis Date Noted  . Frontal skull lesion   . Advance care planning   . Goals of care, counseling/discussion   . Palliative care encounter   . Protein-calorie malnutrition, severe 01/12/2018  . Hip fx ([facility]) 01/10/2018    Past Surgical History:  Procedure Laterality Date  . HIP PINNING,CANNULATED Left 01/11/2018   Procedure: CANNULATED HIP PINNING;  Surgeon: Lovell [name], MD;  Location: [facility] ORS;  Service: Orthopedics;  Laterality: Left;    Prior to Admission medications   Medication Sig Start Date End Date Taking? Authorizing Provider  acetaminophen (TYLENOL) 325 MG tablet Take 2 tablets (650 mg total) by mouth every 6 (six) hours as needed for mild pain (or Fever >/= 101). 01/14/18   Gladstone Lighter, MD  amLODipine (NORVASC) 5 MG tablet Take 1 tablet (5 mg total) by mouth daily. 01/15/18   Gladstone Lighter, MD  docusate sodium (COLACE)  100 MG capsule Take 1 capsule (100 mg total) by mouth 2 (two) times daily. 01/14/18   Gladstone Lighter, MD  enoxaparin (LOVENOX) 30 MG/0.3ML injection Inject 0.3 mLs (30 mg total) into the skin daily for 14 days. 01/14/18 01/28/18  Gladstone Lighter, MD  feeding supplement, ENSURE ENLIVE, (ENSURE ENLIVE) LIQD Take 237 mLs by mouth 2 (two) times daily between meals. 01/14/18   Gladstone Lighter, MD  HYDROcodone-acetaminophen (NORCO/VICODIN) 5-325 MG tablet Take 1-2 tablets by mouth every 6 (six) hours as needed for moderate pain. 01/14/18   Gladstone Lighter, MD  ipratropium-albuterol (DUONEB) 0.5-2.5 (3) MG/3ML SOLN Take 3 mLs by nebulization every 4 (four) hours as needed. 01/14/18   Gladstone Lighter, MD  metoprolol tartrate (LOPRESSOR) 25 MG tablet Take 1 tablet (25 mg total) by mouth 2 (two) times daily. 01/14/18   Gladstone Lighter, MD  nicotine (NICODERM CQ - DOSED IN MG/24 HOURS) 14 mg/24hr patch Place 1 patch (14 mg total) onto the skin daily. 01/15/18   Gladstone Lighter, MD  polyethylene glycol (MIRALAX / GLYCOLAX) packet Take 17 g by mouth daily as needed for mild constipation. 01/14/18   Gladstone Lighter, MD  sodium hypochlorite (DAKIN'S 1/4 STRENGTH) 0.125 % SOLN Irrigate with as directed daily. 01/14/18   Gladstone Lighter, MD    Allergies Lactose intolerance (gi)  History reviewed. No pertinent family history.  Social History Social History   Tobacco Use  . Smoking status: Current Every Day Smoker    Packs/day: 0.50  Types: Cigarettes  . Smokeless tobacco: Never Used  Substance Use Topics  . Alcohol use: Not Currently  . Drug use: Never    Review of Systems Constitutional: No fever/chills Eyes: No visual changes. ENT: No sore throat. Cardiovascular: Denies chest pain. Respiratory: Denies shortness of breath. Gastrointestinal: No abdominal pain.  No nausea, no vomiting.  No diarrhea.   Genitourinary: Negative for dysuria. Musculoskeletal: Negative for  back pain. Skin: Positive for wound to chest. Neurological: Negative for headaches, focal weakness or numbness.  ____________________________________________   PHYSICAL EXAM:  VITAL SIGNS: ED Triage Vitals  Enc Vitals Group     BP 06/17/19 1413 (!) 169/85     Pulse Rate 06/17/19 1413 85     Resp 06/17/19 1413 20     Temp 06/17/19 1413 98.4 F (36.9 C)     Temp Source 06/17/19 1413 Oral     SpO2 06/17/19 1413 99 %     Weight 06/17/19 1414 96 lb (43.5 kg)     Height 06/17/19 1414 5\' 1"  (1.549 m)     Head Circumference --      Peak Flow --      Pain Score 06/17/19 1414 0    Constitutional: Alert and oriented.  Eyes: Conjunctivae are normal.  ENT      Head: Normocephalic and atraumatic.      Nose: No congestion/rhinnorhea.      Mouth/Throat: Mucous membranes are moist.      Neck: No stridor. Hematological/Lymphatic/Immunilogical: No cervical lymphadenopathy. Cardiovascular: Normal rate, regular rhythm.  No murmurs, rubs, or gallops.  Respiratory: Normal respiratory effort without tachypnea nor retractions. Breath sounds are clear and equal bilaterally. No wheezes/rales/rhonchi. Gastrointestinal: Soft and non tender. No rebound. No guarding.  Genitourinary: Deferred Musculoskeletal: Normal range of motion in all extremities. No lower extremity edema. Neurologic:  Normal speech and language. No gross focal neurologic deficits are appreciated.  Skin:  Roughly 4x4 cm L shaped wound to center and right chest. No surrounding erythema or malodor. Slight amount of non purulent drainage.  Psychiatric: Mood and affect are normal. Speech and behavior are normal. Patient exhibits appropriate insight and judgment.  ____________________________________________    LABS (pertinent positives/negatives)  Lactic acid 1.0 CBC wbc 8.0, hgb 12.7, plt 403 CMP na 141, k 3.4, glu 129, cr  0.65  ____________________________________________   EKG  None  ____________________________________________    RADIOLOGY  CXR No acute cardiopulmonary process  ____________________________________________   PROCEDURES  Procedures  ____________________________________________   INITIAL IMPRESSION / ASSESSMENT AND PLAN / ED COURSE  Pertinent labs & imaging results that were available during my care of the patient were reviewed by me and considered in my medical decision making (see chart for details).   Patient presented to the emergency department today because of concern for wound to chest.  This is a known wound that the patient has had for over a year.  Patient's wound does not appear grossly infected today.  [place] her blood work her vital signs suggest infection.  Discussed with the patient that if she wants care for this she will need to follow-up with oncologist.  I did encourage her to pursue this course.  Will also give patient wound care center information.   ____________________________________________   FINAL CLINICAL IMPRESSION(S) / ED DIAGNOSES  Final diagnoses:  Cancer [facility])     Note: This dictation was prepared with Dragon dictation. Any transcriptional errors that result from this process are unintentional     Nance Pear, MD 06/17/19 1627

## 2019-06-17 NOTE — ED Triage Notes (Signed)
Pt presents to ED via POV with family friend. Pt dx with breast cancer approx 6 yrs ago, pt denies having had treatment for it. Pt's support person states several weeks ago noticed a wound to patient's chest. Pt with deep open wound to center of her chest extending into R breast. Wound noted with serosanguinous drainage to bandage that patient has placed on wound.    Pt currently lives at [facility] and has been caring for wound herself.

## 2019-06-24 ENCOUNTER — Ambulatory Visit: Admit: 2019-06-24 | Discharge: 2019-06-25 | Payer: MEDICARE

## 2019-06-25 DIAGNOSIS — C50811 Malignant neoplasm of overlapping sites of right female breast: Principal | ICD-10-CM

## 2019-06-25 DIAGNOSIS — Z170 Estrogen receptor positive status [ER+]: Principal | ICD-10-CM

## 2019-06-25 DIAGNOSIS — C50919 Malignant neoplasm of unspecified site of unspecified female breast: Principal | ICD-10-CM

## 2019-06-25 MED ORDER — TRAMADOL 50 MG TABLET
ORAL_TABLET | Freq: Four times a day (QID) | ORAL | 0 refills | 38.00000 days | Status: CP | PRN
Start: 2019-06-25 — End: 2019-06-25

## 2019-06-25 MED ORDER — LETROZOLE 2.5 MG TABLET
ORAL_TABLET | Freq: Every day | ORAL | 3 refills | 90.00000 days | Status: CP
Start: 2019-06-25 — End: 2019-06-25

## 2019-06-25 MED ORDER — PALBOCICLIB 125 MG TABLET
ORAL_TABLET | Freq: Every day | ORAL | 0 refills | 21.00000 days | Status: CP
Start: 2019-06-25 — End: 2019-07-16

## 2019-06-25 MED ORDER — TRAMADOL 50 MG TABLET: 50 mg | tablet | Freq: Four times a day (QID) | 0 refills | 38 days | Status: AC

## 2019-06-25 MED ORDER — LETROZOLE 2.5 MG TABLET: 3 mg | tablet | Freq: Every day | 3 refills | 90 days | Status: AC

## 2019-06-26 ENCOUNTER — Ambulatory Visit
Admit: 2019-06-26 | Discharge: 2019-06-26 | Payer: MEDICARE | Attending: Hematology & Oncology | Primary: Hematology & Oncology

## 2019-06-26 DIAGNOSIS — N63 Unspecified lump in unspecified breast: Principal | ICD-10-CM

## 2019-06-26 DIAGNOSIS — Z170 Estrogen receptor positive status [ER+]: Principal | ICD-10-CM

## 2019-06-26 DIAGNOSIS — E43 Unspecified severe protein-calorie malnutrition: Principal | ICD-10-CM

## 2019-06-26 DIAGNOSIS — C50811 Malignant neoplasm of overlapping sites of right female breast: Principal | ICD-10-CM

## 2019-06-30 DIAGNOSIS — N63 Unspecified lump in unspecified breast: Principal | ICD-10-CM

## 2019-07-02 NOTE — Unmapped (Signed)
[facility]SC Specialty Medication Onboarding    Specialty Medication: IBRANCE TABLETS  Prior Authorization: Approved   Financial Assistance: No - copay  <$25  Final Copay/Day Supply: $8.58 / 28 DAYS    Insurance Restrictions: Yes - max 1 month supply     Notes to Pharmacist:     The triage team has completed the benefits investigation and has determined that the patient is able to fill this medication at [facility]. Please contact the patient to complete the onboarding or follow up with the prescribing physician as needed.

## 2019-07-06 NOTE — Unmapped (Signed)
Hi Dr. Rosalia Hammers,    Patient Jodi Contreras contacted the Communication Center to cancel their appointment for 07/09/19.  The appointment has been cancelled.    Cancellation Reason: Patient wanted to reschedule appointment after imaging appointments on 07/15/19. I explained that first available follow up wouldn't be until 09/10/19, which she was happy with.    Thank you,  [name]  [facility] Communication [facility]   [phone]

## 2019-07-07 NOTE — Unmapped (Signed)
I spoke with patient Jodi Contreras to confirm appointments on the following date(s): 4.21    [name]

## 2019-07-13 NOTE — Unmapped (Unsigned)
Breast Oncology Clinic    Referring Physician: Referred Self  Jodi Contreras,  [place].     PCP: Tyson Babinski [facility]    Consulting providers:  Surgical Oncology NP: Lowry Bowl, NP  Radiation Oncology: Corinda Gubler, MD    Reason for Visit: A 74 y.o. woman with breast cancer being seen in consultation at the request of Referred Self for recommendations concerning the management of breast cancer.    Assessment/Plan:    [name] is a 74 y.o. woman with history of stroke (2007), history of seizures (2004), 50+ pack-year smoking history with current cigarette use, severe protein-calorie malnutrition, left hip fracture secondary to mechanical fall (2019) and neglected large right breast cancer.    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's CT CAP and bone scan which show ***. We discussed that she also had a prior CT head at [facility] with a sclerotic lesion c/f metastatic disease.    She has started letrozole and is tolerating ***. We discussed that standard of care for first-line HR+, HER2- MBC is ET plus CDK 4/6 inhibitor. The PALOMA-2 trial showed that palbociclib combined with letrozole resulted in significantly longer progression-free survival than that with letrozole alone (24.8 months vs 14.5 months). The most common grade 3 or 4 adverse events were neutropenia, leukopenia, and fatigue. Jodi Contreras, et al. Jodi Contreras [phone].)    We discussed that adding palbociclib will require more lab monitoring including twice monthly labs for the first two months. One of her previously stated goals is to minimize medical appointments. Her primary goal is to treat the cancer-related pain. ***    Potential benefits of the proposed treatment are prolongation of survival, improvement of symptoms and maintaining QoL. The intent of the treatment is palliative. I explained in detail the short-term and long-term risks/side effects that are commonly experienced by patients receiving the proposed treatment including but not limited to neutropenia, leukopenia, fatigue, nausea, arthralgia, alopecia, diarrhea, cough, anemia, back pain, headaches, hot flashes, constipation, rash, thrombocytopenia, vomiting, anorexia, dyspnea, and dry skin. I also explained less common but very severe side effects including a rare risk of death from treatment. We discussed the reasonable alternatives to the proposed treatment, including use of endocrine therapy alone or no treatment at all, and risks associated with the alternatives. I have let her know that she can decide to stop treatment at any time. All of the patient's and family's questions were answered to their satisfaction.    [name] expressed understanding and consents to proceed with the addition of palbociclib.    - Palbociclib 125 mg po daily in 4-week cycle (3 weeks of treatment followed by 1 week off)  - Letrozole 2.5 mg po daily    # Osteopenia  Suspected baseline osteopenia based on prior hip fracture, severe protein-calorie malnutrition, and prior plain film imaging. Although bone-directed therapy is recommended in this context, I am concerned with the patient's ability to adhere to an oral bisphosphate. Given that we now have confirmation of osseous metastases, we will proceed with infusional bisphosphonate therapy.  - calcium and vitamin D supplementation    # Supportive care  - Cancer-related pain in right breast, RUE, and chest wall: Continue APAP, increase to 1000  mg TID; stop ASA; start Tramadol 50 mg po q6h prn. We discussed no early refills, no other prescribers of controlled substances, importance of securing medications away from others. We will assess her pain control at next visit and escalate as needed. May require oxycodone for adequate pain control until systemic therapy can hopefully improve cancer control and allow healing.  - Chest wall wound: No evidence of infection. Will refer to wound care nurse.  - Psychosocial: Patient feels like she is coping generally well and denies any need for CCSP at this time. Concern for denial regarding diagnosis and comorbid depression as reasons for non-adherence to medical therapies. Will continue to assess and enlist support of family and friend, which she has given [place] permission to do.    # Smoking cessation  - Current smoker. Did not discuss cessation at first visit due to patient's extreme tardiness to appointment and competing priorities. Discuss at future visit.    # Goals of Care:  - Discussion of an advanced directive: Did not discuss cessation at first visit due to patient's extreme tardiness to appointment and competing priorities. Discuss at future visit. Given incurable cancer and stated preference to minimize medical care and avoid doctors, I recommend ACP including changing code status to DNR. Will assess patient preferences.  - Patient preferences: Control pain, minimize medical visits  - Support systems: Daughter, friend Jodi Contreras (patient gives [place] permission to discuss medical care with either of these people); Jodi Contreras willing to bring Jodi Contreras to appointments; we can call her if we cannot reach Jodi Contreras by phone  - Palliative care referral: Will defer for now and see if we can achieve improved pain control with cancer-directed therapy and initiation of Tramadol. Low threshold to involve PC in near future, especially if visits can be concurrent with [name]    # Caregiver support:  The patient's friend and daughter are her primary caregivers / support persons. At this time, she is requiring increased assistance with ADLs due to limitations in her RUE. We discussed resources for caregivers at [facility] including CCSP.     # COVID-19 precautions  I recommend that the patient receive the COVID-19 vaccine which is now available to her. We discussed that the vaccine is safe and effective. She is at increased risk of hospitalization or death due to COVID based on her age, smoking history, and advanced cancer. Her friend Jodi Contreras will help her schedule this.    Follow up:   ***    This note with created with dictation software. Please forgive any transcription errors and notify writer if changes are required.     I personally spent *** minutes face-to-face and non-face-to-face in the care of this patient, which includes all pre, intra, and post visit time on the date of service.     --------------------------    Interval History:  [name] is a 74 y.o. woman with history of stroke (2007), history of seizures (2004), 50+ pack-year smoking history with current cigarette use, severe protein-calorie malnutrition, left hip fracture secondary to mechanical fall (2019), and large right neglected breast cancer who presents in follow up for management of now metastatic breast cancer after a 5-year hiatus from oncologic care.    [name] was last seen in my clinic on 06/25/19. Since that time, she has started letrozole. ***    I have reviewed her records including history, imaging, pathology reports, and, when applicable, operative notes and updated her oncologic history below:  Oncology History Overview Note   15 year history of R breast mass that has now become erosive    Breast Imaging:  03/23/2014  Targeted ultrasound of the right central breast demonstrates a corresponding 4.1 x 1.5 cm mass with irregular shape, not parallel orientation, and not circumscribed margins.     Malignant neoplasm of overlapping sites of right female breast (CMS-HCC)   05/23/2013 Biopsy    A: Breast, right central, core needle biopsy   - Invasive lobular carcinoma, classic and pleomorphic types (see comment)   - Nottingham combined histologic grade: 2    Estrogen receptor Positive (100%, 3+), Progesterone receptor Positive (100%, 3+), HER2 negative    B: Upper outer quadrant/axillary mass, right, core needle biopsy   - Lymph node with metastatic breast carcinoma (see comment)     C: Upper outer quadrant/axillary mass, left, core needle biopsy   - Lymph node with metastatic breast carcinoma (see comment)  - Receptor immunohistochemistry:    Comment: The tumor in all three parts is morphologically similar -- invasive carcinoma arranged in nests and with grade 2 nuclei transitions into invasive carcinoma arranged singly and in single file with grade 3 nuclei.     04/07/2014 Initial Diagnosis    Malignant neoplasm of overlapping sites of right female breast (RAF-HCC)     04/2014 Interval Scan(s)    No evidence of distant disease     04/28/2014 -  Cancer Staged    Staging form: Breast, AJCC 7th Edition  - Clinical stage from 04/28/2014: Stage IIIB (T4b, N1, M0) - Signed by Gwinda Passe, MD on 06/23/2019       04/28/2014 Endocrine/Hormone Therapy    Primary endocrine therapy started  Continued for about 1 year until prescription ran out  Patient did not return to oncology clinic after initial consultation     06/26/2019 Progression    Patient presents back to oncology clinic after 5 year hiatus  Worsening right breast and chest wall pain  Large, necrotic chest wall lesion and auto-mastectomy of right breast     06/26/2019 Endocrine/Hormone Therapy    Letrozole restarted         Past Medical History:   Diagnosis Date   ??? Depression    ??? Malignant neoplasm of overlapping sites of right female breast (CMS-HCC) 04/07/2014   ??? Seizures (CMS-HCC)    ??? Stroke (CMS-HCC)        Gyn History: postmenopausal    Social History     Tobacco Use   ??? Smoking status: Current Every Day Smoker     Packs/day: 0.50     Years: 54.00     Pack years: 27.00   ??? Smokeless tobacco: Never Used   Substance Use Topics   ??? Alcohol use: No   ??? Drug use: No       Social History     Social History Narrative    Jodi Contreras lives in [place] by herself. She is not working. She enjoys reading mysteries. She has a daughter in [place] and a son who lives out of state. She has grandchildren whom she sees a few times a month. [name] is a good friend who looks out for her.       Review of Systems: A 12-system review of systems was obtained including: Constitutional, Eyes, ENT, Cardiovascular, Respiratory, GI, GU, Musculoskeletal, Skin, Neurological, Psychiatric, Endocrine, Heme/Lymphatic, and Allergic/Immunologic systems. It is negative or non-contributory to the patient???s management except for the following: depression, anxiety.  ECOG Performance Status: 2 - Ambulatory and capable of all selfcare but unable to carry out any work activities. Up and about more than 50% of waking hours (limited by decreased ROM in RUE)        Physical Examination:  Vital Signs: There were no vitals taken for this visit.  CONSTITUTIONAL: Pleasant, thin-appearing woman in NAD  HEENT: Pupils equally round, sclerae anicteric, conjunctiva clear, moist mucous membranes, OP exam deferred  NECK: Supple  HEME/LYMPH: No palpable cervical or supraclavicular lymphadenopathy; right axillary fullness  CV: Normal rate, regular rhythm, normal S1 and S2, no murmurs, rubs, or gallops; extremities warm, well-perfused, no lower extremity edema  RESP: Lungs clear to auscultation bilaterally, no wheezes, ronchi or rales, normal work of breathing  GI: Soft, non-tender, non-distended, normoactive bowel sounds, no masses or organomegaly  EXT: Digits and nails examined, no joint hypertrophy or nail changes  SKIN AND SUBCUTANEOUS TISSUES: No rashes or ecchymoses  GU/BREAST: On inspection, there is auto mastectomy of the right breast with erosive right nipple, central chest necrosis spanning 8 x 8 cm with serosanguineous drainage, no purulence or malodor; left nipple thickening without palpable mass  MSK: Right arm abduction limited to 90 degrees  NEURO: Alert and oriented, speech intact, following commands, moving all extremities well, no focal deficits appreciated  PSYCH: Normal mood and appropriate affect        I have personally reviewed the breast imaging, laboratory values, existing medical records, and pathology. I have summarized these findings and my interpretation in the oncology history and assessment above.

## 2019-07-13 NOTE — Unmapped (Incomplete)
I recommend that you receive the COVID vaccine if you have not yet received it.    For more information regarding receiving the COVID-19 vaccine at [facility], go to:  [email or web address]  or call [phone] (M-F, 8 am-5pm)    For information regarding [place] priority groups, go to:  [email or web address]    ------------------------    For appointments & urgent questions Monday through Friday 8 AM???5 PM:  Please call [phone] or Toll free [phone]    Your medical oncologist is: Dr. Wendall Papa  Your medical oncology nurse practioner is: Lubertha Basque, NP  Your nurse navigator is: Dan Maker, RN  Your clinical pharmacist is: Dr. Raelene Bott    For urgent clinical needs on Nights, Weekends or Holidays:  Call [phone] and ask for the oncologist on call.   For appointment changes, please contact us during normal business hours, as we do not have staff to assist with scheduling after hours.     You can also message your team using [email or web address]. This is for non-urgent messages only. It may take 24-48 hours for your team to answer you. For non-urgent questions, please consider waiting until your next clinic visit to discuss with your medical team.    ---------------------  Please visit PrivacyFever.cz, a resource created just for family members and caregivers.  This website lists support services, how and where to ask for help. It has tools to assist you as you help [place] care for your loved one.      [facility]  [address]  [place], [place] [address]  [email or web address]

## 2019-07-14 NOTE — Unmapped (Signed)
Outpatient Clinical Social Work Note    Psychosocial update and case management:  Pt called SW and said Monia Pouch has approved her transportation. She has to give them at least a 48 hour notice, so would like her appointments to be rescheduled for next week or later. SW notified Minda Ditto, RN Navigator Dan Maker, and Dr. Rosalia Hammers via Shirleen Schirmer.    Airik Goodlin, LCSW, CCM  [phone]

## 2019-07-14 NOTE — Unmapped (Signed)
Outpatient Clinical Social Work Note    Referral source: Ella Jubilee, Charity fundraiser.    Patient Treatment Status: Pt is a 74 year old female with breast cancer; she recently cancelled her appointments due to transportation.     Psychosocial update and case management:  SW spoke with pt who explained she had cancelled her upcoming appointments because she wasn't able to set up transportation. Pt has a transportation benefit through her [facility] Advantage plan. She said she called around but wasn't able to figure out the address for [facility], so she wasn't able to book transportation for her appointment. SW gave her the address. Pt plans to call Monia Pouch to make sure transportation to [facility] is approved, then will call SW back to reschedule her appointments.    SW briefly talked to pt about [employer] gas card program. Pt was not interested - she said everyone she knows that could drive her has a job, so gas cards would not help her as it is a scheduling issue.    SW will follow up with pt in a few days to see if she was able to schedule transportation. If not SW will talk with pt about additional resources.    Pt has SW's contact information and SW encouraged pt to call SW with any questions.         Kaelea Gathright, LCSW, CCM  [phone]

## 2019-07-15 NOTE — Unmapped (Signed)
I spoke with patient Jodi Contreras to confirm appointments on the following date(s): 4.29    [name]

## 2019-07-16 NOTE — Unmapped (Signed)
I spoke with patient Jodi Contreras to confirm appointments on the following date(s): 4.30    [name]

## 2019-07-22 DIAGNOSIS — N63 Unspecified lump in unspecified breast: Principal | ICD-10-CM

## 2019-07-22 NOTE — Unmapped (Signed)
Hi,     W[facility] Healing Clinic in [place] contacted the Communication Center requesting to speak with the care team of Feliberto Gottron to discuss:    Wanted to notify care team that they contacted the patient to schedule a wound care appointment but had to leave a VM.    Please contact the clinic at [phone].      Thank you,   [name]  [facility]   [phone]

## 2019-07-22 NOTE — Unmapped (Addendum)
Received return call from [facility] stating that patient does not want to be seen there and would prefer to be seen at [facility].     Call made to [facility] at [phone]. Spoke with personnel who stated that they had previously tried to reach out to her but never got a return call from pt. Requested that we send over a new referral and they will reach out to her today to schedule. Fax number [phone].    Epic faxed new referral order to provided fax number.

## 2019-07-22 NOTE — Unmapped (Signed)
New referral placed to [place] [facility]. Call made to [place] [facility] to discuss referral and expedite getting patient into their clinic. Personnel stated they would contact the patient today and document in the referral tab, if patient amendable to a visit.

## 2019-07-22 NOTE — Unmapped (Signed)
Addended by: Adan Baehr, Clint Lipps B on: 07/22/2019 01:30 PM     Modules accepted: Orders

## 2019-07-23 NOTE — Unmapped (Signed)
Call returned to wound clinic to follow up on requested appointment. Personnel stated that the patient was contacted yesterday and they had to leave a message for patient to return call. As of now, patient has not called back to schedule appt.     Requested that wound clinic attempt to reach patient again today to get her scheduled. Personnel stated they would reach out again today to attempt to schedule. Reviewed that we will be seeing the patient tomorrow in clinic and can discuss importance of scheduling appt with wound care, if she hasn't already scheduled the appt by then.

## 2019-07-24 ENCOUNTER — Ambulatory Visit
Admit: 2019-07-24 | Discharge: 2019-07-25 | Payer: MEDICARE | Attending: Hematology & Oncology | Primary: Hematology & Oncology

## 2019-07-24 ENCOUNTER — Ambulatory Visit: Admit: 2019-07-24 | Discharge: 2019-08-05 | Payer: MEDICARE

## 2019-07-24 DIAGNOSIS — Z170 Estrogen receptor positive status [ER+]: Principal | ICD-10-CM

## 2019-07-24 DIAGNOSIS — C50811 Malignant neoplasm of overlapping sites of right female breast: Principal | ICD-10-CM

## 2019-07-24 NOTE — Unmapped (Signed)
Breast Oncology Clinic    Referring Physician: Referred Self  Jodi Contreras,  [place].     PCP: Jodi Contreras [facility]    Consulting providers:  Surgical Oncology NP: Lowry Bowl, NP  Radiation Oncology: Jodi Gubler, MD    Reason for Visit: management of breast cancer    Assessment/Plan:    Jodi Contreras is a 74 y.o. woman with history of stroke (2007), history of seizures (2004), 50+ pack-year smoking history with current cigarette use, severe protein-calorie malnutrition, left hip fracture secondary to mechanical fall (2019) and neglected large right breast cancer.    # Breast cancer, right, ILC G2, ER positive 100%, PR positive 100%, HER2 negative, with biopsy-proven involvement of ipsilateral and contralateral axillary lymph nodes, started on primary endocrine therapy (2016, at the time no distant disease), now with metastatic involvement of bones (sclerotic lesion on CT head 2019)    At last visit, I ordered a CT CAP and bone scan for restaging given the locally advanced tumor and sclerotic lesion seen on CT head in 2019.  She has not yet had this imaging performed as she did not show for these appointments due to lack of transportation.  We discussed that she likely now has metastatic disease based on the 2019 imaging but restaging imaging would confirm this and provide a new baseline with reinitiation of therapy.    We discussed that I do not think her breast cancer is likely to be curable. We again discussed the general principles of treating metastatic breast cancer including goals to control symptoms, control cancer growth, and maintain quality of life, even in the absence of a cure. We discussed that cancer-directed therapy is generally continued indefinitely and that there is seldom a role for local therapies such as surgery or radiation, unless needed to palliate specific symptoms.     She has started letrozole and is tolerating this well so far. We discussed that standard of care for first-line HR+, HER2- MBC is ET plus CDK 4/6 inhibitor. The PALOMA-2 trial showed that palbociclib combined with letrozole resulted in significantly longer progression-free survival than that with letrozole alone (24.8 months vs 14.5 months). The most common grade 3 or 4 adverse events were neutropenia, leukopenia, and fatigue. Jodi Contreras RS, et al. Jodi Contreras J Med [phone].)    We discussed that adding palbociclib will require more lab monitoring including twice monthly labs for the first two months. One of her previously stated goals is to minimize medical appointments. Her primary goal is to treat the cancer-related pain.  We discussed that starting this medicine and not adhering to recommended lab monitoring would be risky, including her risk of infections related to cytopenias.  For now, will defer initiation of palbociclib, although would recommend initiation within the next 1 to 2 months if demonstrates adherence to recommended medical care.    -Obtain CT chest, abdomen, pelvis, bone scan as soon as possible  - Letrozole 2.5 mg po daily   -Start palbociclib in next 1 to 2 months    # Osteopenia, suspected osseous metastases  Suspected baseline osteopenia based on prior hip fracture, severe protein-calorie malnutrition, and prior plain film imaging.  Based on her CT head from 2019, it is also likely that she now has osseous metastases and would be a candidate for bone directed therapy.  Given that she was not able to have labs drawn yesterday, I will not start this today.  Given her nonadherence with recommended appointments, I would favor use of  denosumab for ease of administration (rather than Zometa).  - calcium and vitamin D supplementation  -Plan to start denosumab at next visit    # Supportive care  - Cancer-related pain in right breast, RUE, and chest wall: Continue APAP, increase to 1000 mg TID; stop ASA; continue tramadol 50 mg po q6h prn. We discussed no early refills, no other prescribers of controlled substances, importance of securing medications away from others.  Pain control is now improved.  Will continue to assess and escalate as needed. May require oxycodone for adequate pain control until systemic therapy can hopefully improve cancer control and allow healing.  - Chest wall wound: No evidence of infection. Will refer to wound care nurse.  Dressing changed today and supplies provided.  - Psychosocial: Patient feels like she is coping generally well and denies any need for CCSP at this time. Concern for denial regarding diagnosis and comorbid depression as reasons for non-adherence to medical therapies. Will continue to assess and enlist support of family and friend, which she has given [place] permission to do.    # Smoking cessation  - Current smoker. Did not discuss cessation yet due to competing priorities. Discuss at future visit.    # Goals of Care:  - Discussion of an advanced directive: Did not discuss cessation at first visit due to patient's extreme tardiness to appointment and competing priorities. Discuss at future visit. Given incurable cancer and stated preference to minimize medical care and avoid doctors, I recommend ACP including changing code status to DNR. Will assess patient preferences.  - Patient preferences: Control pain, minimize medical visits  - Support systems: Daughter, friend Jodi Contreras (patient gives [place] permission to discuss medical care with either of these people); Jodi Contreras willing to bring Jodi Contreras to appointments; we can call her if we cannot reach Jodi Contreras by phone.  Daughter was unable to commit to helping bring her mother to appointments and did not elaborate on this.  She was disengaged during the clinic visit today.  - Palliative care referral: Will defer for now and see if we can achieve improved pain control with cancer-directed therapy and initiation of Tramadol. Low threshold to involve PC in near future, especially if visits can be concurrent with med onc appts    # Coordination of care  Patient has had multiple no-shows and extreme tardiness to visit.  She attributes this to lack of transportation.  She has limited support and no one who is clearly available to bring her to appointments.  She relies on public transportation or transportation through her insurance.  She and her daughter request referral to oncologist in [place] to facilitate easier access to care.  We discussed that I would place this referral and I am happy to see her back at any time at the request of the patient or her local oncologist.  ???Referral to medical oncology at [facility]    # Chronic medical conditions, need for primary care  Patient has multiple chronic medical conditions including hypertension which have not been managed over the past few years due to lack of primary care.  She is scheduled to meet a new primary care provider next week.  I encouraged her to attend this appointment.    # Caregiver support:  The patient's friend and daughter are her primary caregivers / support persons. At this time, she is requiring increased assistance with ADLs due to limitations in her RUE. We discussed resources for caregivers at [facility] including CCSP.     #  COVID-19 precautions  I recommend that the patient receive the COVID-19 vaccine which is now available to her. We discussed that the vaccine is safe and effective. She is at increased risk of hospitalization or death due to COVID based on her age, smoking history, and advanced cancer. Her friend Jodi Contreras will help her schedule this.    Follow up:   As needed    This note with created with dictation software. Please forgive any transcription errors and notify writer if changes are required.     I personally spent 50 minutes face-to-face and non-face-to-face in the care of this patient, which includes all pre, intra, and post visit time on the date of service.     --------------------------    Interval History:  Jodi Contreras is a 75 y.o. woman with history of stroke (2007), history of seizures (2004), 50+ pack-year smoking history with current cigarette use, severe protein-calorie malnutrition, left hip fracture secondary to mechanical fall (2019), and large right neglected breast cancer who presents in follow up for management of now metastatic breast cancer after a 5-year hiatus from oncologic care.    Jodi Contreras was last seen in my clinic on 06/25/19. Since that time, she has started letrozole.  She reports good adherence to the drug and denies problems with hot flashes, vaginal dryness, or arthralgias.    At the last visit, we also started tramadol 50 mg every 6 hours as needed.  She is taking this a few times a day, and feels like her pain is under much better control.  It is not completely gone but it is interfering less with her day-to-day function.    She has not noticed any changes on her chest.  She continues to have a weeping, large wound.  She is performing dressing changes at home and has an upcoming appointment at [facility] for wound care consultation.    She feels like she has no energy.  She eats small meals several times a day but continues to lose weight.    She is not really sure what her goal is in her treatment.  She previously indicated that it was to reduce pain.    She missed her appointment yesterday for a bone scan and CT scan.  She also missed her laboratory appointments.  She indicates that she had requested transportation through her insurance provider but they did not show up.  Her daughter was unaware of these appointments.  Today, we discussed the importance of coming to scheduled visit so that we can provide the best possible care.  Her daughter is unable to commit to bring her to appointments and does not elaborate on this.  Jodi Contreras and her daughter feel that referral to an oncologist closer to home would enable them to better engage with her care.    I have reviewed her records including history, imaging, pathology reports, and, when applicable, operative notes and updated her oncologic history below:    Oncology History Overview Note   15 year history of R breast mass that has now become erosive    Breast Imaging:  03/23/2014  Targeted ultrasound of the right central breast demonstrates a corresponding 4.1 x 1.5 cm mass with irregular shape, not parallel orientation, and not circumscribed margins.     Malignant neoplasm of overlapping sites of right female breast (CMS-HCC)   05/23/2013 Biopsy    A: Breast, right central, core needle biopsy   - Invasive lobular carcinoma, classic and pleomorphic types (see  comment)   - Nottingham combined histologic grade: 2    Estrogen receptor Positive (100%, 3+), Progesterone receptor Positive (100%, 3+), HER2 negative    B: Upper outer quadrant/axillary mass, right, core needle biopsy   - Lymph node with metastatic breast carcinoma (see comment)     C: Upper outer quadrant/axillary mass, left, core needle biopsy   - Lymph node with metastatic breast carcinoma (see comment)  - Receptor immunohistochemistry:    Comment: The tumor in all three parts is morphologically similar -- invasive carcinoma arranged in nests and with grade 2 nuclei transitions into invasive carcinoma arranged singly and in single file with grade 3 nuclei.     04/07/2014 Initial Diagnosis    Malignant neoplasm of overlapping sites of right female breast (RAF-HCC)     04/2014 Interval Scan(s)    No evidence of distant disease     04/28/2014 -  Cancer Staged    Staging form: Breast, AJCC 7th Edition  - Clinical stage from 04/28/2014: Stage IIIB (T4b, N1, M0) - Signed by Gwinda Passe, MD on 06/23/2019       04/28/2014 Endocrine/Hormone Therapy    Primary endocrine therapy started  Continued for about 1 year until prescription ran out  Patient did not return to oncology clinic after initial consultation     01/12/2018 Interval Scan(s)    CT head at [facility]  Sclerotic lesion in skull c/f metastatic disease     06/26/2019 Progression    Patient presents back to oncology clinic after 5 year hiatus  Worsening right breast and chest wall pain  Large, necrotic chest wall lesion and auto-mastectomy of right breast     06/26/2019 Endocrine/Hormone Therapy    Letrozole restarted         Past Medical History:   Diagnosis Date   ??? Depression    ??? Malignant neoplasm of overlapping sites of right female breast (CMS-HCC) 04/07/2014   ??? Seizures (CMS-HCC)    ??? Stroke (CMS-HCC)        Gyn History: postmenopausal    Social History     Tobacco Use   ??? Smoking status: Current Every Day Smoker     Packs/day: 0.50     Years: 54.00     Pack years: 27.00   ??? Smokeless tobacco: Never Used   Substance Use Topics   ??? Alcohol use: No   ??? Drug use: No       Social History     Social History Narrative    Jodi Contreras lives in [place] by herself. She is not working. She enjoys reading mysteries. She has a daughter in [place] and a son who lives out of state. She has grandchildren whom she sees a few times a month. Jodi Contreras is a good friend who looks out for her.       Review of Systems: A 12-system review of systems was obtained including: Constitutional, Eyes, ENT, Cardiovascular, Respiratory, GI, GU, Musculoskeletal, Skin, Neurological, Psychiatric, Endocrine, Heme/Lymphatic, and Allergic/Immunologic systems. It is negative or non-contributory to the patient???s management except for the following: depression, anxiety, weight loss.        ECOG Performance Status: 2 - Ambulatory and capable of all selfcare but unable to carry out any work activities. Up and about more than 50% of waking hours (limited by decreased ROM in RUE)        Physical Examination:  Vital Signs: BP 149/68  - Pulse 73  - Temp 36.4 ??C (97.5 ??  F) (Temporal)  - Resp 17  - Ht 154.9 cm (5' 0.98)  - Wt 40.9 kg (90 lb 3.2 oz)  - SpO2 95%  - BMI 17.05 kg/m??   CONSTITUTIONAL: Pleasant, thin-appearing woman in NAD  HEENT: Pupils equally round, sclerae anicteric, conjunctiva clear, moist mucous membranes, OP exam deferred  HEME/LYMPH: No palpable cervical or supraclavicular lymphadenopathy; right axillary fullness  EXT: Digits and nails examined, no joint hypertrophy or nail changes  GU/BREAST: On inspection, there is auto mastectomy of the right breast with erosive right nipple, central chest necrosis spanning 10 x 10 cm with serosanguineous drainage, no purulence or malodor; left nipple thickening without palpable mass (photo below)  MSK: Right arm abduction limited to 90 degrees  NEURO: Alert and oriented, speech intact, following commands, moving all extremities well, no focal deficits appreciated  PSYCH: Normal mood and appropriate affect    Central/right chest wall, 07/24/19:          I have personally reviewed the breast imaging, laboratory values, existing medical records, and pathology. I have summarized these findings and my interpretation in the oncology history and assessment above.

## 2019-07-24 NOTE — Unmapped (Signed)
I recommend that you receive the COVID vaccine if you have not yet received it.    For more information regarding receiving the COVID-19 vaccine at [facility], go to:  [email or web address]  or call [phone] (M-F, 8 am-5pm)    For information regarding [place] priority groups, go to:  [email or web address]    ------------------------    For appointments & urgent questions Monday through Friday 8 AM???5 PM:  Please call [phone] or Toll free [phone]    Your medical oncologist is: Dr. Wendall Papa  Your medical oncology nurse practioner is: Lubertha Basque, NP  Your nurse navigator is: Dan Maker, RN  Your clinical pharmacist is: Dr. Raelene Bott    For urgent clinical needs on Nights, Weekends or Holidays:  Call [phone] and ask for the oncologist on call.   For appointment changes, please contact us during normal business hours, as we do not have staff to assist with scheduling after hours.     You can also message your team using [email or web address]. This is for non-urgent messages only. It may take 24-48 hours for your team to answer you. For non-urgent questions, please consider waiting until your next clinic visit to discuss with your medical team.    ---------------------  Please visit [email or web address].cz, a resource created just for family members and caregivers.  This website lists support services, how and where to ask for help. It has tools to assist you as you help [place] care for your loved one.      [facility]  [address]  [place], [place] [address]  [email or web address]

## 2019-07-24 NOTE — Unmapped (Signed)
Hi,     Natalia Leatherwood contacted the [employer] requesting to speak with the care team of Jodi Contreras to discuss:    Patient called to let Dr. Rosalia Hammers know that she didn't have labs or scans done yesterday.    Please contact Natalia Leatherwood at [phone].        Check Indicates criteria has been reviewed and confirmed with the patient:    [x]  Preferred Name   [x]  DOB and/or MR#  [x]  Preferred Contact Method  [x]  Phone Number(s)   []  MyChart     Thank you,   Jacques Navy  [facility]   [phone]

## 2019-07-28 ENCOUNTER — Encounter: Payer: Medicare HMO | Attending: Physician Assistant | Admitting: Physician Assistant

## 2019-07-28 ENCOUNTER — Other Ambulatory Visit: Payer: Self-pay

## 2019-07-28 DIAGNOSIS — L98492 Non-pressure chronic ulcer of skin of other sites with fat layer exposed: Secondary | ICD-10-CM | POA: Insufficient documentation

## 2019-07-28 DIAGNOSIS — F17218 Nicotine dependence, cigarettes, with other nicotine-induced disorders: Secondary | ICD-10-CM | POA: Insufficient documentation

## 2019-07-28 DIAGNOSIS — C50919 Malignant neoplasm of unspecified site of unspecified female breast: Secondary | ICD-10-CM | POA: Insufficient documentation

## 2019-07-30 NOTE — Progress Notes (Signed)
Janet [name], STIKA ([id number]) Visit Report for 07/28/2019 Chief Complaint Document Details Patient Name: Janet [name] Date of Service: 07/28/2019 2:15 PM Medical Record Number: [id number] Patient Account Number: [id number] Date of Birth/Sex: Sep 11, 1945 (74 y.o. 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 F) Treating RN: Montey Hora Primary Care Provider: Lavera Guise Other Clinician: Referring Provider: Tish Men Treating Provider/Extender: Melburn Hake, Janet [name] [name] in Treatment: 0 Debridement Performed for Wound #1 Left Breast Assessment: Performed By: Physician Janet III, Chanan Detwiler E., PA-C Debridement Type: Chemical/Enzymatic/Mechanical Agent Used: saline and gauze Level of Consciousness (Pre- Awake and Alert procedure): Pre-procedure Verification/Time Out Yes - 15:22 Taken: Start Time: 15:22 Pain Control: Lidocaine 4% Topical Solution Instrument: Other : gauze Bleeding: Minimum Hemostasis Achieved: Pressure End Time: 15:23 Procedural Pain: 0 Post Procedural Pain: 0 Response to Treatment: Procedure was tolerated well Level of Consciousness (Post- Awake and Alert procedure): Post Debridement Measurements of Total Wound Length: (cm) 6 Width: (cm) 7.5 Depth: (cm) 0.1 Volume: (cm) 3.534 Character of Wound/Ulcer Post  Debridement: Improved Post Procedure Diagnosis Same as Pre-procedure Electronic Signature(s) Signed: 07/28/2019 4:47:44 PM By: Montey Hora Signed: 07/28/2019 6:00:40 PM By: Worthy Keeler PA-C Entered By: Montey Hora on 07/28/2019 15:24:28 Janet [name] ([id number]) -------------------------------------------------------------------------------- HPI Details Patient Name: Janet [name] Date of Service: 07/28/2019 2:15 PM Medical Record Number: [id number] Patient Account Number: [id number] Date of Birth/Sex: 1945/04/19 (74 y.o. F) Treating RN: Montey Hora Primary Care Provider: Lavera Guise Other Clinician: Referring Provider: Tish Men Treating Provider/Extender: Melburn Hake, Janet [name] [name] in Treatment: 0 History of Present Illness HPI Description: 07/28/2019 upon evaluation today patient presents for initial inspection here in our clinic concerning issues that she has been having with a ulcer on her chest wall at the right breast location. She does have breast cancer and this has been present for a number of years. With that being said she has opted to not go through any treatment based on what she tells me today. Obviously I think that this is still something that we can help her with as far as making recommendations for dressing changes. That is mainly what she is here for today. She does have a history of nicotine dependence but otherwise really has no significant major medical problems. Electronic Signature(s) Signed: 07/28/2019 5:32:36 PM By: Worthy Keeler PA-C Entered By: Worthy Keeler on 07/28/2019 17:32:36 Janet [name] ([id number]) -------------------------------------------------------------------------------- Physical Exam Details Patient Name: Janet [name] Date of Service: 07/28/2019 2:15 PM Medical Record Number: [id number] Patient Account Number: [id number] Date of Birth/Sex: June 27, 1945 (73 y.o. F) Treating RN: Montey Hora Primary Care Provider: Lavera Guise Other  Clinician: Referring Provider: Tish Men Treating Provider/Extender: Janet [name] [name] in Treatment: 0 Constitutional patient is hypertensive.. pulse regular and within target range for patient.Marland Kitchen respirations regular, non-labored and within target range for patient.Marland Kitchen temperature within target range for patient.. Well-nourished and well-hydrated in no acute distress. Eyes conjunctiva clear no eyelid edema noted. pupils equal round and reactive to light and accommodation. Ears, Nose, Mouth, and Throat no gross abnormality of ear auricles or external auditory  canals. normal hearing noted during conversation. mucus membranes moist. Respiratory normal breathing without difficulty. Cardiovascular no clubbing, cyanosis, significant edema, <3 sec cap refill. Musculoskeletal normal gait and posture. no significant deformity or arthritic changes, no loss or range of motion, no clubbing. Psychiatric this patient is able to make decisions and demonstrates good insight into disease process. Alert and Oriented x 3. pleasant and cooperative. Notes Upon inspection patient's ulcer on the right chest wall actually is somewhat deep there is no bone exposed mainly this is fat layer exposure. With that being said she does have some mild discomfort but nothing significant and again she is having drainage she tells me she typically has to change this 2 times a day. Fortunately there is no evidence of active infection to be honest which is great news and overall I feel like the patient is doing decently well all things considered. Electronic Signature(s) Signed: 07/28/2019 5:33:18 PM By: Worthy Keeler PA-C Entered By: Worthy Keeler on 07/28/2019 17:33:18 [place], Belenda [name] ([id number]) -------------------------------------------------------------------------------- Physician Orders Details Patient Name: Janet [name] Date of Service: 07/28/2019 2:15 PM Medical Record Number: [id number] Patient Account  Number: [id number] Date of Birth/Sex: 1946/02/26 (73 y.o. F) Treating RN: Montey Hora Primary Care Provider: Lavera Guise Other Clinician: Referring Provider: Tish Men Treating Provider/Extender: Melburn Hake, Keaun Schnabel [name] in Treatment: 0 Verbal / Phone Orders: No Diagnosis Coding ICD-10 Coding Code Description C50.919 Malignant neoplasm of unspecified site of unspecified female breast F17.218 Nicotine dependence, cigarettes, with other nicotine-induced disorders L98.492 Non-pressure chronic ulcer of skin of other sites with fat layer exposed Wound Cleansing Wound #1 Left Breast o May Shower, gently pat wound dry prior to applying new dressing. Primary Wound Dressing Wound #1 Left Breast o Silver Alginate - wet with saline or soapy water to remove if needed Secondary Dressing Wound #1 Left Breast o ABD pad o Other - carboflex for odor if needed Dressing Change Frequency Wound #1 Left Breast o Change dressing every day. Follow-up Appointments o Return Appointment in 2 [name]. Electronic Signature(s) Signed: 07/28/2019 4:47:44 PM By: Montey Hora Signed: 07/28/2019 6:00:40 PM By: Worthy Keeler PA-C Entered By: Montey Hora on 07/28/2019 15:26:42 JAMYLA, MONTERA ([id number]) -------------------------------------------------------------------------------- Problem List Details Patient Name: Janet [name] Date of Service: 07/28/2019 2:15 PM Medical Record Number: [id number] Patient Account Number: [id number] Date of Birth/Sex: Jun 06, 1945 (74 y.o. F) Treating RN: Montey Hora Primary Care Provider: Lavera Guise Other Clinician: Referring Provider: Tish Men Treating Provider/Extender: Melburn Hake, Jermesha Sottile [name] in Treatment: 0 Active Problems ICD-10 Encounter Code Description Active Date MDM Diagnosis C50.919 Malignant neoplasm of unspecified site of unspecified female breast 07/28/2019 No Yes F17.218 Nicotine dependence, cigarettes, with other nicotine-induced disorders  07/28/2019 No Yes L98.492 Non-pressure chronic ulcer of skin of other sites with fat layer exposed 07/28/2019 No Yes Inactive Problems Resolved Problems Electronic Signature(s) Signed: 07/28/2019 2:54:47 PM By: Worthy Keeler PA-C Previous Signature: 07/28/2019 2:53:03 PM Version By: Worthy Keeler PA-C Entered By: Worthy Keeler on 07/28/2019 14:54:46 [place], Ka ([id number]) -------------------------------------------------------------------------------- Progress Note Details Patient Name: Janet [name] Date of Service: 07/28/2019 2:15 PM Medical Record Number: [id number] Patient Account Number: [id number] Date of Birth/Sex: 03/25/1946 (73 y.o. 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 She does have breast cancer and this has been present for a number of years. With that being said she has opted to not go through any treatment based on what she tells me today. Obviously I think that this is still something that we can help her with as far as making recommendations for dressing changes. That is mainly what she is here for today. She does have a history of nicotine dependence but otherwise really has no significant major medical problems. Patient History Information obtained from Patient. Allergies No Known Drug Allergies Family History Cancer - Father, Diabetes - Maternal Grandparents, Stroke - Mother, No family history of Hypertension. Social History Current every day smoker, Marital Status - Divorced, Alcohol Use - Never,  Drug Use - No History, Caffeine Use - Daily. 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 Ear/Nose/Mouth/Throat Denies complaints or symptoms of Difficult clearing ears, Sinusitis. Hematologic/Lymphatic Denies complaints or symptoms of Bleeding / Clotting Disorders, Human Immunodeficiency Virus. Respiratory Denies complaints or symptoms of Chronic or frequent coughs, Shortness of Breath. Cardiovascular Denies complaints or symptoms of Chest pain, LE edema. Gastrointestinal Denies complaints or symptoms of Frequent diarrhea, Nausea, Vomiting. Endocrine Denies complaints or symptoms of Hepatitis, Thyroid disease, Polydypsia (Excessive Thirst). Genitourinary Denies complaints or symptoms of Kidney failure/ Dialysis, Incontinence/dribbling. Immunological Denies complaints or symptoms of Hives, Itching. Integumentary (Skin) Complains or has symptoms of Wounds - breast, Bleeding or bruising tendency - bruise. Musculoskeletal Denies complaints or symptoms of Muscle Pain, Muscle Weakness. Neurologic Denies complaints or symptoms of Numbness/parasthesias, Focal/Weakness. Psychiatric Complains or has symptoms of Anxiety. TAIA, BACH ([id number]) Objective Constitutional patient is hypertensive.. pulse regular and within target range for patient.Marland Kitchen respirations regular, non-labored and within target range for patient.Marland Kitchen temperature within target range for patient.. Well-nourished and well-hydrated in no acute distress. Vitals Time Taken: 2:30 PM, Height: 61 in, Source: Stated, Weight: 89 lbs, Source: Measured, BMI: 16.8, Temperature: 98.6 F, Pulse: 87 bpm, Respiratory Rate: 16 breaths/min, Blood Pressure: 147/55 mmHg. Eyes conjunctiva clear no  eyelid edema noted. pupils equal round and reactive to light and accommodation. Ears, Nose, Mouth, and Throat no gross abnormality of ear auricles or external auditory canals. normal hearing noted during conversation. mucus membranes moist. Respiratory normal breathing without difficulty. Cardiovascular no clubbing, cyanosis, significant edema, Musculoskeletal normal gait and posture. no significant deformity or arthritic changes, no loss or range of motion, no clubbing. Psychiatric this patient is able to make decisions and demonstrates good insight into disease process. Alert and Oriented x 3. pleasant and cooperative. General Notes: Upon inspection patient's ulcer on the right chest wall actually is somewhat deep there is no bone exposed mainly this is fat layer exposure. With that being said she does have some mild discomfort but nothing significant and again she is having drainage she tells me she typically has to change this 2 times a day. Fortunately there is no evidence of active infection to be honest which is great news and overall I feel like the patient is doing decently well all things considered. Integumentary (Hair, Skin) Wound #1 status is Open. Original cause of wound was Gradually Appeared. The wound is located on the Left Breast. The wound measures 6cm length x 7.5cm width x 0.1cm  depth; 35.343cm^2 area and 3.534cm^3 volume. There is Fat Layer (Subcutaneous Tissue) Exposed exposed. There is no tunneling or undermining noted. There is a large amount of serous drainage noted. The wound margin is flat and intact. There is large (67-100%) red, friable granulation within the wound bed. There is a small (1-33%) amount of necrotic tissue within the wound bed including Adherent Slough. Assessment Active Problems ICD-10 Malignant neoplasm of unspecified site of unspecified female breast Nicotine dependence, cigarettes, with other nicotine-induced disorders Non-pressure chronic  ulcer of skin of other sites with fat layer exposed Procedures Wound #1 Pre-procedure diagnosis of Wound #1 is a Malignant Wound located on the Left Breast . There was a Chemical/Enzymatic/Mechanical debridement performed by Janet III, Srinika Delone E., PA-C. With the following instrument(s): gauze after achieving pain control using Lidocaine 4% Topical Solution. Other agent used was saline and gauze. A time out was conducted at 15:22, prior to the start of the procedure. A Minimum amount of bleeding was controlled with Pressure. The procedure was tolerated well with a pain level of 0 throughout and a pain level of 0 following the procedure. Post Debridement Measurements: 6cm length x 7.5cm width x 0.1cm depth; 3.534cm^3 volume. Character of Wound/Ulcer Post Debridement is improved. Post procedure Diagnosis Wound #1: Same as Pre-Procedure HADELYN, PRETE ([id number]) Plan Wound Cleansing: Wound #1 Left Breast: May Shower, gently pat wound dry prior to applying new dressing. Primary Wound Dressing: Wound #1 Left Breast: Silver Alginate - wet with saline or soapy water to remove if needed Secondary Dressing: Wound #1 Left Breast: ABD pad Other - carboflex for odor if needed Dressing Change Frequency: Wound #1 Left Breast: Change dressing every day. Follow-up Appointments: Return Appointment in 2 [name]. 1. My suggestion at this time is good to be that we go ahead and initiate treatment with but I am not sure if her insurance will cover this if not then silver alginate dressing. I believe that this can be very beneficial for the patient. I would really like for her to have silver cell hopefully any alginate should be beneficial as it should really stick badly to the wound especially if it is moistened down well upon removal even with warm soapy water. 2. I am also going to suggest the patient to change this once a day. Obviously if she can go every 2 days that will be fine as well but we will  see how things do initially. 3. We will give her CarboFlex as well to use for the odor as needed if she is having a lot more drainage this can be beneficial. We will see patient back for reevaluation in 2 [name] here in the clinic. If anything worsens or changes patient will contact our office for additional recommendations. Electronic Signature(s) Signed: 07/28/2019 5:34:14 PM By: Worthy Keeler PA-C Entered By: Worthy Keeler on 07/28/2019 17:34:14 [place], Belenda [name] ([id number]) -------------------------------------------------------------------------------- ROS/PFSH Details Patient Name: Janet [name] Date of Service: 07/28/2019 2:15 PM Medical Record Number: [id number] Patient Account Number: [id number] Date of Birth/Sex: 1946/02/10 (73 y.o. F) Treating RN: Cornell Barman Primary Care Provider: Lavera Guise Other Clinician: Referring Provider: Tish Men Treating Provider/Extender: Melburn Hake, Stalin Gruenberg [name] in Treatment: 0 Information Obtained From Patient Eyes Complaints and Symptoms: Positive for: Glasses / Contacts Ear/Nose/Mouth/Throat Complaints and Symptoms: Negative for: Difficult clearing ears; Sinusitis Hematologic/Lymphatic Complaints and Symptoms: Negative for: Bleeding / Clotting Disorders; Human Immunodeficiency Virus Respiratory Complaints and Symptoms: Negative for: Chronic or frequent coughs; Shortness of Breath Cardiovascular Complaints and Symptoms: Negative for:  Chest pain; LE edema Gastrointestinal Complaints and Symptoms: Negative for: Frequent diarrhea; Nausea; Vomiting Endocrine Complaints and Symptoms: Negative for: Hepatitis; Thyroid disease; Polydypsia (Excessive Thirst) Genitourinary Complaints and Symptoms: Negative for: Kidney failure/ Dialysis; Incontinence/dribbling Immunological Complaints and Symptoms: Negative for: Hives; Itching Integumentary (Skin) Complaints and Symptoms: Positive for: Wounds - breast; Bleeding or bruising tendency -  bruise Musculoskeletal Complaints and Symptoms: Negative for: Muscle Pain; Muscle Weakness Medical HistoryMarland Kitchen RAYMONA, BROACH ([id number]) Past Medical History Notes: 2019 Left Hip Replacement Neurologic Complaints and Symptoms: Negative for: Numbness/parasthesias; Focal/Weakness Psychiatric Complaints and Symptoms: Positive for: Anxiety Constitutional Symptoms (General Health) Oncologic Medical History: Negative for: Received Chemotherapy; Received Radiation Past Medical History Notes: Breast cancer 2015-no treatement Immunizations Pneumococcal Vaccine: Received Pneumococcal Vaccination: No Implantable Devices None Family and Social History Cancer: Yes - Father; Diabetes: Yes - Maternal Grandparents; Hypertension: No; Stroke: Yes - Mother; Current every day smoker; Marital Status - Divorced; Alcohol Use: Never; Drug Use: No History; Caffeine Use: Daily Electronic Signature(s) Signed: 07/28/2019 6:00:40 PM By: Worthy Keeler PA-C Signed: 07/29/2019 5:22:43 PM By: Gretta Cool, BSN, RN, CWS, Kim RN, BSN Entered By: Gretta Cool, BSN, RN, CWS, Kim on 07/28/2019 14:40:22 JAMESYN, PLAYER ([id number]) -------------------------------------------------------------------------------- [place] Details Patient Name: Janet [name] Date of Service: 07/28/2019 Medical Record Number: [id number] Patient Account Number: [id number] Date of Birth/Sex: 25-Dec-1945 (73 y.o. F) Treating RN: Montey Hora Primary Care Provider: Lavera Guise Other Clinician: Referring Provider: Tish Men Treating Provider/Extender: Melburn Hake, Demarrion Meiklejohn [name] in Treatment: 0 Diagnosis Coding ICD-10 Codes Code Description C50.919 Malignant neoplasm of unspecified site of unspecified female breast F17.218 Nicotine dependence, cigarettes, with other nicotine-induced disorders L98.492 Non-pressure chronic ulcer of skin of other sites with fat layer exposed Facility Procedures CPT4 Code: [id number] Description: 99213 - WOUND CARE  VISIT-LEV 3 EST PT Modifier: Quantity: 1 Physician Procedures CPT4 Code: [id number] Description: WC PHYS LEVEL 3 o NEW PT Modifier: Quantity: 1 CPT4 Code: Description: ICD-10 Diagnosis Description C50.919 Malignant neoplasm of unspecified site of unspecified female breast F17.218 Nicotine dependence, cigarettes, with other nicotine-induced disord L98.492 Non-pressure chronic ulcer of skin of other sites  with fat layer ex Modifier: ers posed Quantity: Electronic Signature(s) Signed: 07/28/2019 5:34:37 PM By: Worthy Keeler PA-C Previous Signature: 07/28/2019 5:34:26 PM Version By: Worthy Keeler PA-C Previous Signature: 07/28/2019 4:47:44 PM Version By: Montey Hora Entered By: Worthy Keeler on 07/28/2019 17:34:37

## 2019-07-30 NOTE — Progress Notes (Addendum)
Janet [name], Janet [name] ([id number]) Visit Report for 07/28/2019 Allergy List Details Patient Name: Janet [name] Date of Service: 07/28/2019 2:15 PM Medical Record Number: [id number] Patient Account Number: [id number] Date of Birth/Sex: [birth year] (74 y.o. F) Treating RN: Cornell Barman Primary Care Nekia Maxham: Lavera Guise Other Clinician: Referring Derris Millan: Tish Men Treating Milagros Middendorf/Extender: Melburn Hake, HOYT Weeks in Treatment: 0 Allergies Active Allergies No Known Drug Allergies Allergy Notes Electronic Signature(s) Signed: 07/29/2019 5:22:43 PM By: Gretta Cool, BSN, RN, CWS, Kim RN, BSN Entered By: Gretta Cool, BSN, RN, CWS, Kim on 07/28/2019 14:36:15 Janet [name] ([id number]) -------------------------------------------------------------------------------- Arrival Information Details Patient Name: Janet [name] Date of Service: 07/28/2019 2:15 PM Medical Record Number: [id number] Patient Account Number: [id number] Date of Birth/Sex: [birth year] (73 y.o. F) Treating RN: Montey Hora Primary Care Concettina Leth: Lavera Guise Other Clinician: Referring Zora Glendenning: Tish Men Treating Vicie Cech/Extender: Melburn Hake, HOYT Weeks in Treatment: 0 Visit Information Patient Arrived: Ambulatory Arrival Time: 14:29 Accompanied By: self Transfer Assistance: None Patient Identification Verified: Yes Secondary Verification Process Completed: Yes Electronic Signature(s) Signed: 07/28/2019 2:58:24 PM By: Lorine Bears RCP, RRT, CHT Entered By: Lorine Bears on 07/28/2019 14:29:51 Janet [name] ([id number]) -------------------------------------------------------------------------------- Clinic Level of Care Assessment Details Patient Name: Janet [name] Date of Service: 07/28/2019 2:15 PM Medical Record Number: [id number] Patient Account Number: [id number] Date of Birth/Sex: 05/13/1945 (73 y.o. F) Treating RN: Montey Hora Primary Care Elham Fini: Lavera Guise Other Clinician: Referring  Dailey Alberson: Tish Men Treating Becka Lagasse/Extender: Melburn Hake, HOYT Weeks in Treatment: 0 Clinic Level of Care Assessment Items TOOL 2 Quantity Score []  - Use when only an EandM is performed on the INITIAL visit 0 ASSESSMENTS - Nursing Assessment / Reassessment X - General Physical Exam (combine w/ comprehensive assessment (listed just below) when performed on new 1 20 pt. evals) X- 1 25 Comprehensive Assessment (HX, ROS, Risk Assessments, Wounds Hx, etc.) ASSESSMENTS - Wound and Skin Assessment / Reassessment X - Simple Wound Assessment / Reassessment - one wound 1 5 []  - 0 Complex Wound Assessment / Reassessment - multiple wounds []  - 0 Dermatologic / Skin Assessment (not related to wound area) ASSESSMENTS - Ostomy and/or Continence Assessment and Care []  - Incontinence Assessment and Management 0 []  - 0 Ostomy Care Assessment and Management (repouching, etc.) PROCESS - Coordination of Care X - Simple Patient / Family Education for ongoing care 1 15 []  - 0 Complex (extensive) Patient / Family Education for ongoing care X- 1 10 Staff obtains Programmer, systems, Records, Test Results / Process Orders []  - 0 Staff telephones HHA, Nursing Homes / Clarify orders / etc []  - 0 Routine Transfer to another Facility (non-emergent condition) []  - 0 Routine Hospital Admission (non-emergent condition) []  - 0 New Admissions / Biomedical engineer / Ordering NPWT, Apligraf, etc. []  - 0 Emergency Hospital Admission (emergent condition) X- 1 10 Simple Discharge Coordination []  - 0 Complex (extensive) Discharge Coordination PROCESS - Special Needs []  - Pediatric / Minor Patient Management 0 []  - 0 Isolation Patient Management []  - 0 Hearing / Language / Visual special needs []  - 0 Assessment of Community assistance (transportation, D/C planning, etc.) []  - 0 Additional assistance / Altered mentation []  - 0 Support Surface(s) Assessment (bed, cushion, seat, etc.) INTERVENTIONS - Wound  Cleansing / Measurement X - Wound Imaging (photographs - any number of wounds) 1 5 []  - 0 Wound Tracing (instead of photographs) X- 1 5 Simple Wound Measurement - one wound []  - 0 Complex Wound Measurement - multiple wounds [place], Janet [name] ([id number]) X- 1 5 Simple Wound Cleansing -  one wound []  - 0 Complex Wound Cleansing - multiple wounds INTERVENTIONS - Wound Dressings X - Small Wound Dressing one or multiple wounds 1 10 []  - 0 Medium Wound Dressing one or multiple wounds []  - 0 Large Wound Dressing one or multiple wounds []  - 0 Application of Medications - injection INTERVENTIONS - Miscellaneous []  - External ear exam 0 []  - 0 Specimen Collection (cultures, biopsies, blood, body fluids, etc.) []  - 0 Specimen(s) / Culture(s) sent or taken to Lab for analysis []  - 0 Patient Transfer (multiple staff / Civil Service fast streamer / Similar devices) []  - 0 Simple Staple / Suture removal (25 or less) []  - 0 Complex Staple / Suture removal (26 or more) []  - 0 Hypo / Hyperglycemic Management (close monitor of Blood Glucose) []  - 0 Ankle / Brachial Index (ABI) - do not check if billed separately Has the patient been seen at the hospital within the last three years: Yes Total Score: 110 Level Of Care: New/Established - Level 3 Electronic Signature(s) Signed: 07/28/2019 4:47:44 PM By: Montey Hora Entered By: Montey Hora on 07/28/2019 16:07:27 Janet [name] ([id number]) -------------------------------------------------------------------------------- Encounter Discharge Information Details Patient Name: Janet [name] Date of Service: 07/28/2019 2:15 PM Medical Record Number: [id number] Patient Account Number: [id number] Date of Birth/Sex: March 11, 1946 (74 y.o. F) Treating RN: Montey Hora Primary Care Earlin Sweeden: Lavera Guise Other Clinician: Referring Derryck Shahan: Tish Men Treating Davanee Klinkner/Extender: Melburn Hake, HOYT Weeks in Treatment: 0 Encounter Discharge Information Items Post  Procedure Vitals Discharge Condition: Stable Temperature (F): 98.6 Ambulatory Status: Ambulatory Pulse (bpm): 87 Discharge Destination: Home Respiratory Rate (breaths/min): 16 Transportation: Private Auto Blood Pressure (mmHg): 147/55 Accompanied By: self Schedule Follow-up Appointment: Yes Clinical Summary of Care: Electronic Signature(s) Signed: 07/28/2019 4:47:44 PM By: Montey Hora Entered By: Montey Hora on 07/28/2019 16:09:09 Janet [name] ([id number]) -------------------------------------------------------------------------------- Lower Extremity Assessment Details Patient Name: Janet [name] Date of Service: 07/28/2019 2:15 PM Medical Record Number: [id number] Patient Account Number: [id number] Date of Birth/Sex: 1945-10-14 (73 y.o. F) Treating RN: Cornell Barman Primary Care Tavin Vernet: Lavera Guise Other Clinician: Referring Rhina Kramme: Tish Men Treating Donta Mcinroy/Extender: Sharalyn Ink in Treatment: 0 Electronic Signature(s) Signed: 07/29/2019 5:22:43 PM By: Gretta Cool, BSN, RN, CWS, Kim RN, BSN Entered By: Gretta Cool, BSN, RN, CWS, Kim on 07/28/2019 14:48:52 Janet [name] ([id number]) -------------------------------------------------------------------------------- Multi Wound Chart Details Patient Name: Janet [name] Date of Service: 07/28/2019 2:15 PM Medical Record Number: [id number] Patient Account Number: [id number] Date of Birth/Sex: September 13, 1945 (73 y.o. F) Treating RN: Cornell Barman Primary Care Brielynn Sekula: Lavera Guise Other Clinician: Referring Ireta Pullman: Tish Men Treating Lahari Suttles/Extender: Melburn Hake, HOYT Weeks in Treatment: 0 Vital Signs Height(in): 61 Pulse(bpm): 2 Weight(lbs): 49 Blood Pressure(mmHg): 147/55 Body Mass Index(BMI): 17 Temperature(F): 98.6 Respiratory Rate(breaths/min): 16 Photos: [N/A:N/A] Wound Location: Left Breast N/A N/A Wounding Event: Gradually Appeared N/A N/A Primary Etiology: Malignant Wound N/A N/A Date Acquired: 07/28/2018 N/A  N/A Weeks of Treatment: 0 N/A N/A Wound Status: Open N/A N/A Measurements L x W x D (cm) 6x7.5x0.1 N/A N/A Area (cm) : 35.343 N/A N/A Volume (cm) : 3.534 N/A N/A Classification: Full Thickness Without Exposed N/A N/A Support Structures Exudate Amount: Large N/A N/A Exudate Type: Serous N/A N/A Exudate Color: amber N/A N/A Wound Margin: Flat and Intact N/A N/A Granulation Amount: Large (67-100%) N/A N/A Granulation Quality: Red, Friable N/A N/A Necrotic Amount: Small (1-33%) N/A N/A Exposed Structures: Fat Layer (Subcutaneous Tissue) N/A N/A Exposed: Yes Fascia: No Tendon: No Muscle: No Joint: No Bone: No Epithelialization: None N/A N/A Treatment Notes Electronic  Signature(s) Signed: 07/29/2019 5:22:43 PM By: Gretta Cool, BSN, RN, CWS, Kim RN, BSN Entered By: Gretta Cool, BSN, RN, CWS, Kim on 07/28/2019 14:50:35 Janet [name], Janet [name] ([id number]) -------------------------------------------------------------------------------- Multi-Disciplinary Care Plan Details Patient Name: Janet [name] Date of Service: 07/28/2019 2:15 PM Medical Record Number: [id number] Patient Account Number: [id number] Date of Birth/Sex: 1946/01/26 (73 y.o. F) Treating RN: Cornell Barman Primary Care Piper Albro: Lavera Guise Other Clinician: Referring Ameshia Pewitt: Tish Men Treating Roma Bondar/Extender: Worthy Keeler Weeks in Treatment: 0 Active Inactive Electronic Signature(s) Signed: 09/14/2019 3:18:32 PM By: Gretta Cool, BSN, RN, CWS, Kim RN, BSN Previous Signature: 07/29/2019 5:22:43 PM Version By: Gretta Cool, BSN, RN, CWS, Kim RN, BSN Entered By: Gretta Cool, BSN, RN, CWS, Kim on 09/14/2019 15:18:32 Janet [name], Janet [name] ([id number]) -------------------------------------------------------------------------------- Pain Assessment Details Patient Name: Janet [name] Date of Service: 07/28/2019 2:15 PM Medical Record Number: [id number] Patient Account Number: [id number] Date of Birth/Sex: 1945-12-09 (73 y.o. F) Treating RN: Montey Hora Primary  Care Vishruth Seoane: Lavera Guise Other Clinician: Referring Idonna Heeren: Tish Men Treating Djon Tith/Extender: Melburn Hake, HOYT Weeks in Treatment: 0 Active Problems Location of Pain Severity and Description of Pain Patient Has Paino No Site Locations Pain Management and Medication Current Pain Management: Electronic Signature(s) Signed: 07/28/2019 2:58:24 PM By: Paulla Fore, RRT, CHT Signed: 07/28/2019 4:47:44 PM By: Montey Hora Entered By: Lorine Bears on 07/28/2019 14:30:03 Janet [name] ([id number]) -------------------------------------------------------------------------------- Patient/Caregiver Education Details Patient Name: Janet [name] Date of Service: 07/28/2019 2:15 PM Medical Record Number: [id number] Patient Account Number: [id number] Date of Birth/Gender: [birth year] (73 y.o. F) Treating RN: Montey Hora Primary Care Physician: Lavera Guise Other Clinician: Referring Physician: Tish Men Treating Physician/Extender: Sharalyn Ink in Treatment: 0 Education Assessment Education Provided To: Patient Education Topics Provided Wound/Skin Impairment: Handouts: Other: wound care as ordered Methods: Demonstration, Explain/Verbal Responses: State content correctly Electronic Signature(s) Signed: 07/28/2019 4:47:44 PM By: Montey Hora Entered By: Montey Hora on 07/28/2019 16:07:50 Janet [name], Janet [name] ([id number]) -------------------------------------------------------------------------------- Wound Assessment Details Patient Name: Janet [name] Date of Service: 07/28/2019 2:15 PM Medical Record Number: [id number] Patient Account Number: [id number] Date of Birth/Sex: Aug 07, 1945 (73 y.o. F) Treating RN: Cornell Barman Primary Care Pura Picinich: Lavera Guise Other Clinician: Referring Tonie Vizcarrondo: Tish Men Treating Mikko Lewellen/Extender: Melburn Hake, HOYT Weeks in Treatment: 0 Wound Status Wound Number: 1 Primary Etiology: Malignant Wound Wound  Location: Left Breast Wound Status: Open Wounding Event: Gradually Appeared Date Acquired: 07/28/2018 Weeks Of Treatment: 0 Clustered Wound: No Photos Wound Measurements Length: (cm) 6 Width: (cm) 7.5 Depth: (cm) 0.1 Area: (cm) 35.343 Volume: (cm) 3.534 % Reduction in Area: % Reduction in Volume: Epithelialization: None Tunneling: No Undermining: No Wound Description Classification: Full Thickness Without Exposed Support Structu Wound Margin: Flat and Intact Exudate Amount: Large Exudate Type: Serous Exudate Color: amber res Foul Odor After Cleansing: No Slough/Fibrino Yes Wound Bed Granulation Amount: Large (67-100%) Exposed Structure Granulation Quality: Red, Friable Fascia Exposed: No Necrotic Amount: Small (1-33%) Fat Layer (Subcutaneous Tissue) Exposed: Yes Necrotic Quality: Adherent Slough Tendon Exposed: No Muscle Exposed: No Joint Exposed: No Bone Exposed: No Electronic Signature(s) Signed: 07/29/2019 5:22:43 PM By: Gretta Cool, BSN, RN, CWS, Kim RN, BSN Entered By: Gretta Cool, BSN, RN, CWS, Kim on 07/28/2019 14:48:42 Janet [name] ([id number]) -------------------------------------------------------------------------------- [place] Details Patient Name: Janet [name] Date of Service: 07/28/2019 2:15 PM Medical Record Number: [id number] Patient Account Number: [id number] Date of Birth/Sex: Apr 23, 1945 (73 y.o. F) Treating RN: Montey Hora Primary Care Berlin Viereck: Lavera Guise Other Clinician: Referring Reathel Turi: Tish Men Treating Oryon Gary/Extender: STONE III, HOYT Weeks in Treatment: 0 Vital Signs Time Taken: 14:30 Temperature (  F): 98.6 Height (in): 61 Pulse (bpm): 87 Source: Stated Respiratory Rate (breaths/min): 16 Weight (lbs): 89 Blood Pressure (mmHg): 147/55 Source: Measured Reference Range: 80 - 120 mg / dl Body Mass Index (BMI): 16.8 Electronic Signature(s) Signed: 07/28/2019 2:58:24 PM By: Lorine Bears RCP, RRT, CHT Entered By:  Lorine Bears on 07/28/2019 14:31:42

## 2019-07-30 NOTE — Progress Notes (Signed)
Janet [name], Janet [name] ([id number]) Visit Report for 07/28/2019 Abuse/Suicide Risk Screen Details Patient Name: Janet [name], Janet [name] Date of Service: 07/28/2019 2:15 PM Medical Record Number: [id number] Patient Account Number: [id number] Date of Birth/Sex: 1946/01/08 (74 y.o. F) Treating RN: Cornell Barman Primary Care Khaleel Beckom: Lavera Guise Other Clinician: Referring Akiko Schexnider: Tish Men Treating Armonii Sieh/Extender: Melburn Hake, HOYT Weeks in Treatment: 0 Abuse/Suicide Risk Screen Items Answer ABUSE RISK SCREEN: Has anyone close to you tried to hurt or harm you recentlyo No Do you feel uncomfortable with anyone in your familyo No Has anyone forced you do things that you didnot want to doo No Electronic Signature(s) Signed: 07/29/2019 5:22:43 PM By: Gretta Cool, BSN, RN, CWS, Kim RN, BSN Entered By: Gretta Cool, BSN, RN, CWS, Kim on 07/28/2019 14:40:50 Janet [name] ([id number]) -------------------------------------------------------------------------------- Activities of Daily Living Details Patient Name: Janet [name] Date of Service: 07/28/2019 2:15 PM Medical Record Number: [id number] Patient Account Number: [id number] Date of Birth/Sex: 1946-01-02 (74 y.o. F) Treating RN: Cornell Barman Primary Care Ryllie Nieland: Lavera Guise Other Clinician: Referring Toa Mia: Tish Men Treating Rocket Gunderson/Extender: Melburn Hake, HOYT Weeks in Treatment: 0 Activities of Daily Living Items Answer Activities of Daily Living (Please select one for each item) Drive Automobile Not Able Take Medications Completely Able Use Telephone Completely Able Care for Appearance Completely Able Use Toilet Completely Able Bath / Shower Completely Able Dress Self Completely Able Feed Self Completely Able Walk Completely Able Get In / Out Bed Completely Able Housework Completely Able Prepare Meals Completely Able Handle Money Completely Able Shop for Self Completely Able Electronic Signature(s) Signed: 07/29/2019 5:22:43 PM By: Gretta Cool, BSN, RN, CWS,  Kim RN, BSN Entered By: Gretta Cool, BSN, RN, CWS, Kim on 07/28/2019 14:41:20 Janet [name] ([id number]) -------------------------------------------------------------------------------- Education Screening Details Patient Name: Janet [name] Date of Service: 07/28/2019 2:15 PM Medical Record Number: [id number] Patient Account Number: [id number] Date of Birth/Sex: 1945-08-02 (74 y.o. F) Treating RN: Cornell Barman Primary Care Kentley Blyden: Lavera Guise Other Clinician: Referring Camdyn Laden: Tish Men Treating Cheralyn Oliver/Extender: Sharalyn Ink in Treatment: 0 Primary Learner Assessed: Patient Learning Preferences/Education Level/Primary Language Learning Preference: Explanation, Demonstration Highest Education Level: College or Above Preferred Language: English Cognitive Barrier Language Barrier: No Translator Needed: No Memory Deficit: No Emotional Barrier: No Physical Barrier Impaired Vision: No Impaired Hearing: No Decreased Hand dexterity: No Knowledge/Comprehension Knowledge Level: High Comprehension Level: High Ability to understand written instructions: High Ability to understand verbal instructions: High Motivation Anxiety Level: Calm Cooperation: Cooperative Education Importance: Acknowledges Need Interest in Health Problems: Asks Questions Perception: Coherent Willingness to Engage in Self-Management High Activities: Readiness to Engage in Self-Management High Activities: Engineer, maintenance) Signed: 07/29/2019 5:22:43 PM By: Gretta Cool, BSN, RN, CWS, Kim RN, BSN Entered By: Gretta Cool, BSN, RN, CWS, Kim on 07/28/2019 14:41:49 Janet [name], Janet [name] ([id number]) -------------------------------------------------------------------------------- Fall Risk Assessment Details Patient Name: Janet [name] Date of Service: 07/28/2019 2:15 PM Medical Record Number: [id number] Patient Account Number: [id number] Date of Birth/Sex: 12-17-1945 (74 y.o. F) Treating RN: Cornell Barman Primary Care  Yuriel Lopezmartinez: Lavera Guise Other Clinician: Referring Trew Sunde: Tish Men Treating Harika Laidlaw/Extender: Melburn Hake, HOYT Weeks in Treatment: 0 Fall Risk Assessment Items Have you had 2 or more falls in the last 12 monthso 0 No Have you had any fall that resulted in injury in the last 12 monthso 0 No FALLS RISK SCREEN History of falling - immediate or within 3 months 0 No Secondary diagnosis (Do you have 2 or more medical diagnoseso) 0 No Ambulatory aid None/bed rest/wheelchair/nurse 0 Yes Crutches/cane/walker 0 No Furniture 0 No Intravenous therapy Access/Saline/Heparin Lock 0  No Gait/Transferring Normal/ bed rest/ wheelchair 0 Yes Weak (short steps with or without shuffle, stooped but able to lift head while walking, may 0 No seek support from furniture) Impaired (short steps with shuffle, may have difficulty arising from chair, head down, impaired 0 No balance) Mental Status Oriented to own ability 0 Yes Electronic Signature(s) Signed: 07/29/2019 5:22:43 PM By: Gretta Cool, BSN, RN, CWS, Kim RN, BSN Entered By: Gretta Cool, BSN, RN, CWS, Kim on 07/28/2019 14:42:06 Janet [name] ([id number]) -------------------------------------------------------------------------------- Foot Assessment Details Patient Name: Janet [name] Date of Service: 07/28/2019 2:15 PM Medical Record Number: [id number] Patient Account Number: [id number] Date of Birth/Sex: 03-Jun-1945 (74 y.o. F) Treating RN: Cornell Barman Primary Care Eddi Hymes: Lavera Guise Other Clinician: Referring Katelind Pytel: Tish Men Treating Stephanie Littman/Extender: Melburn Hake, HOYT Weeks in Treatment: 0 Foot Assessment Items Site Locations + = Sensation present, - = Sensation absent, C = Callus, U = Ulcer R = Redness, W = Warmth, M = Maceration, PU = Pre-ulcerative lesion F = Fissure, S = Swelling, D = Dryness Assessment Right: Left: Other Deformity: No No Prior Foot Ulcer: No No Prior Amputation: No No Charcot Joint: No No Ambulatory Status: Ambulatory  Without Help Gait: Steady Electronic Signature(s) Signed: 07/29/2019 5:22:43 PM By: Gretta Cool, BSN, RN, CWS, Kim RN, BSN Entered By: Gretta Cool, BSN, RN, CWS, Kim on 07/28/2019 14:42:48 Janet [name] ([id number]) -------------------------------------------------------------------------------- Nutrition Risk Screening Details Patient Name: Janet [name] Date of Service: 07/28/2019 2:15 PM Medical Record Number: [id number] Patient Account Number: [id number] Date of Birth/Sex: 1945-12-21 (73 y.o. F) Treating RN: Cornell Barman Primary Care Kynsley Whitehouse: Lavera Guise Other Clinician: Referring Toni Demo: Tish Men Treating Dontrae Morini/Extender: Melburn Hake, HOYT Weeks in Treatment: 0 Height (in): 61 Weight (lbs): 89 Body Mass Index (BMI): 16.8 Nutrition Risk Screening Items Score Screening NUTRITION RISK SCREEN: I have an illness or condition that made me change the kind and/or amount of food I eat 0 No I eat fewer than two meals per day 0 No I eat few fruits and vegetables, or milk products 0 No I have three or more drinks of beer, liquor or wine almost every day 0 No I have tooth or mouth problems that make it hard for me to eat 0 No I don't always have enough money to buy the food I need 0 No I eat alone most of the time 0 No I take three or more different prescribed or over-the-counter drugs a day 0 No Without wanting to, I have lost or gained 10 pounds in the last six months 2 Yes I am not always physically able to shop, cook and/or feed myself 0 No Nutrition Protocols Good Risk Protocol 0 No interventions needed Moderate Risk Protocol 0 Provide education on nutrition High Risk Proctocol Risk Level: Good Risk Score: 2 Electronic Signature(s) Signed: 07/29/2019 5:22:43 PM By: Gretta Cool, BSN, RN, CWS, Kim RN, BSN Entered By: Gretta Cool, BSN, RN, CWS, Kim on 07/28/2019 14:42:37

## 2019-08-11 ENCOUNTER — Ambulatory Visit: Payer: Medicare HMO | Admitting: Physician Assistant

## 2019-08-25 ENCOUNTER — Ambulatory Visit: Payer: Medicare HMO | Admitting: Physician Assistant

## 2019-08-25 MED ORDER — OXYCODONE 5 MG TABLET
ORAL_TABLET | ORAL | 0 refills | 10 days | Status: CP | PRN
Start: 2019-08-25 — End: ?

## 2019-08-25 MED ORDER — POLYETHYLENE GLYCOL 3350 17 GRAM ORAL POWDER PACKET
PACK | Freq: Every day | ORAL | 2 refills | 72.00000 days | Status: CP
Start: 2019-08-25 — End: ?

## 2019-08-25 NOTE — Unmapped (Signed)
Call made to patient to follow up on pain and POC. Inquired if patient had established care at [facility] in [place], as we had previously referred her in April. She states that no, she has not heard from them regarding this yet. This RN to follow up on this today and will request expedite. Patient verbalizes that she still wishes to get care locally. She made an appt through the phone room for follow up with Dr. Rosalia Hammers on 6/17, but is okay with [place] cancelling this if we can get her in with a local oncologist.    Informed patient that Dr. Rosalia Hammers is okay managing her pain until she is established with a local oncologist. Once established locally, she will need the local oncologist to take over pain management and prescription refills. She verbalized understanding.    Informed patient that Dr. Rosalia Hammers will likely send in a prescription for her today. She requested that anything being sent in be sent to the [facility] in [place]. This RN added this to the patient's preferred pharmacy list.     This RN to follow up with the team and then follow up with the patient re local oncologist appt.

## 2019-08-25 NOTE — Unmapped (Signed)
[name] contacted the [employer] requesting to speak with the care team of Jodi Contreras to discuss:    Calling back to request that oxycodone prescription be sent to the [facility] in [place].    Please contact [name] at [phone].    Thank you,   [name]  [facility]   [phone]

## 2019-08-25 NOTE — Unmapped (Signed)
Hi,     Eliza, EC of patient has contacted the Communication Center in regards to the following symptom:     Pain: worsening     She's been having a constant battle with back pain and pain in her chest area. 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 She's been taking tylenol and tramdol but they're not helping with the pain. Theora Gianotti denies difficulty breathing.    Please contact Theora Gianotti at [phone].    Check Indicates criteria has been reviewed and confirmed with the patient:    [x]  Preferred Name   [x]  DOB and/or MR#  [x]  Preferred Contact Method  [x]  Phone Number(s)   []  MyChart     A page or telephone call has been made to the corresponding clinic.     Thank you,  Laverna Peace   [facility]   [phone]

## 2019-08-25 NOTE — Unmapped (Signed)
AOC Triage Note     Patient: Jodi Contreras     Reason for call:  return call    Time call returned: 1044     Phone Assessment: pt calling to report increasing back/chest wall pain.  The pt has had pain for a while and she was started on tramadol last visit and it appeared to help some for a while but it is no longer helping.  She is taking tramdol q 6 hours but is taking tylenol and ibuprofen in between.      Her pain is chest wall pain from her cancer with pain in her back also.  Both the chest and the back pain appears to be getting worse and has made it difficult to do much other than just lay around.  She is not nearly as active due to the pain. She describes the pain as aching and similar to muscular pain.    Goal for this communication:  Pt is asking for something more for cancer pain     Triage Recommendations:   Pt has an appt on 6/17 with Dr Rosalia Hammers.  Pt has tried heat and ice, heat helps somewhat but ice does not help with the pain.  Pain ointments have not helped either.    RN will consult with team and someone will call the pt back with further information

## 2019-08-26 NOTE — Unmapped (Signed)
Addended by: Gwinda Passe. on: 08/25/2019 08:39 PM     Modules accepted: Orders

## 2019-08-26 NOTE — Unmapped (Signed)
Triage RN returned call to Theora Gianotti to advise prescription for oxycodone has been sent to [facility] in [place].  Theora Gianotti states great appreciation.  She states that the pt's daughter, Herbert Seta has picked up Ms. Olegario Messier from the hotel and Ms. Olegario Messier will be staying with Herbert Seta until she can move into the assisted living facility, but they are all waiting anxiously to set up appts with local MD.

## 2019-09-01 NOTE — Unmapped (Signed)
Attempted to contact patient as well as emergency contact, Theora Gianotti. LVM on both phone lines stating that the local oncologist's office has been trying to reach Jodi Contreras about setting up an appointment. Provided Efraim Kaufmann, coordinator at OSH, contact phone number of [phone] and encouraged both contacts to reach out to schedule the appointment.

## 2019-09-08 ENCOUNTER — Ambulatory Visit: Payer: Medicare HMO | Admitting: Physician Assistant

## 2019-09-09 NOTE — Unmapped (Signed)
Hi,    Patient Jodi Contreras contacted the Communication Center to cancel their appointment for tomorrow. The appointment has been cancelled.    Cancellation Reason: Patient says she has not had her scans yet.     Thank you,  Laverna Peace  [facility] Cancer Communication Center   [phone]

## 2019-09-10 NOTE — Unmapped (Signed)
Call made to patient to follow up on cancelled appointment. Unable to reach patient on phone number on file. Voicemail box is full, unable to LVM.    Call made to [name], emergency contact. Informed Ms. Gayla Doss that the patient cancelled her appointment today and we were calling to understand where she is at with being seen locally. Theora Gianotti states that patient was trying to get into an assisted living facility, but was told that she cannot reside there because she has a hole in her chest. She is now living with her daughter and per Theora Gianotti, the daughter is not taking care of the patient.     Inquired if patient has set up care at local onc, per previous request. Theora Gianotti states that the patient is not being seen by anyone at this time. Stressed the importance of the patient being seen, either locally or by [facility]. Informed Theora Gianotti that this RN was unable to connect w/ the patient by phone today nor were we able to leave a message. Provided Theora Gianotti with the phone number of the coordinator at OSH to set up appointment w/ local onc. Theora Gianotti states that she is going to contact the patient to follow up on how she'd like to proceed. Theora Gianotti to call [place] back at [phone] once she understands patient's wishes.

## 2019-12-08 ENCOUNTER — Ambulatory Visit: Admit: 2019-12-08 | Discharge: 2019-12-09 | Payer: MEDICARE

## 2019-12-08 ENCOUNTER — Ambulatory Visit: Admit: 2019-12-08 | Discharge: 2019-12-21 | Payer: MEDICARE

## 2019-12-08 MED ADMIN — iohexoL (OMNIPAQUE) 350 mg iodine/mL solution 100 mL: 100 mL | INTRAVENOUS | @ 16:00:00 | Stop: 2019-12-08

## 2019-12-08 MED ADMIN — Technetium Tc-99m Oxidronate HDP: 20.2 | INTRAVENOUS | @ 15:00:00 | Stop: 2019-12-08

## 2019-12-09 NOTE — Unmapped (Signed)
Hi,     Theora Gianotti contacted the [employer] regarding the following:    - States the patient has finished her scans and needs an appointment prior on October.     Please contact Theora Gianotti at [phone].    Thanks in advance,    Drema Balzarine  [facility] Cancer Communication Center   [phone]

## 2019-12-09 NOTE — Unmapped (Signed)
Hi,    Patient Jodi Contreras called requesting a medication refill for the following:    ??? Medication:Roxocodone  ??? Dosage: 5 MG  ??? Days left of medication: 0  ??? Pharmacy: [facility]     The expected turnaround time is 3-4 business days       Check Indicates criteria has been reviewed and confirmed with the patient:    [x]  Preferred Name   [x]  DOB and/or MR#  [x]  Preferred Contact Method  [x]  Phone Number(s)   [x]  Preferred Pharmacy   []  MyChart     Thank you,  Drema Balzarine  [facility] Cancer Communication Center  [phone]

## 2019-12-10 DIAGNOSIS — C50811 Malignant neoplasm of overlapping sites of right female breast: Principal | ICD-10-CM

## 2019-12-10 DIAGNOSIS — Z170 Estrogen receptor positive status [ER+]: Principal | ICD-10-CM

## 2019-12-10 NOTE — Unmapped (Signed)
Call made to patient to follow up on plans for ongoing care.    Inquired w/ patient whether she has established care in [place], where we previously referred her to. Patient states that she never heard from anyone in [place] for an appointment, so she hasn't been seen locally by anyone for her cancer care. She states she would prefer to continue getting her care with Dr. Rosalia Hammers at [facility].    Encouraged patient, in the future, to reach out to the team if certain things don't get scheduled, so that we are able to follow up on these items.     Informed patient that Dr. Rosalia Hammers would like to see her in clinic to review the scans she recently had done. She is agreeable to this. Informed patient that this RN would work with Dr. Rosalia Hammers on finding a date/time to bring her back into the clinic. Reviewed w/ patient that once we know of a date/time, we will be in touch. Encouraged her to be on the lookout for a call from our team. She verbalized understanding.

## 2019-12-10 NOTE — Unmapped (Signed)
Hi,     Patient contacted the Communication Center requesting results of the following:     Procedure: CT & bone scan  Completed On: 12/08/2019    Please contact Feliberto Gottron at [phone] for proper follow up.    Check Indicates criteria has been reviewed and confirmed with the patient:    [x]  Preferred Name   [x]  DOB and/or MR#  [x]  Preferred Contact Method  [x]  Phone Number(s)   []  MyChart     Thank you,   Laverna Peace  [facility] Cancer Communication Center   [phone]

## 2019-12-10 NOTE — Unmapped (Signed)
Call made to patient. No answer, left VM. Have not seen patient in 5 months. Previously indicated a desire to transfer care to medical oncologist in [place] due to lack of transportation leading to frequent no-shows. Per chart review, she has not established care elsewhere and came for these scans that were ordered 6 months ago.    I am happy to see her back in person to discuss scan results or can again attempt referral to someone closer to her home. I have left a message asking her to call back to let us know how she wants to proceed.

## 2019-12-11 NOTE — Unmapped (Signed)
Aware. 

## 2019-12-29 DIAGNOSIS — Z170 Estrogen receptor positive status [ER+]: Principal | ICD-10-CM

## 2019-12-29 DIAGNOSIS — J439 Emphysema, unspecified: Principal | ICD-10-CM

## 2019-12-29 DIAGNOSIS — C50811 Malignant neoplasm of overlapping sites of right female breast: Principal | ICD-10-CM

## 2019-12-29 DIAGNOSIS — C7951 Secondary malignant neoplasm of bone: Principal | ICD-10-CM

## 2019-12-29 NOTE — Unmapped (Signed)
Left message about the lab appt that was added on 10.7.

## 2019-12-29 NOTE — Unmapped (Signed)
Outpatient Clinical Social Work Note    Psychosocial update and case management:  SW received message from Dr. Rosalia Hammers that pt has upcoming appointment (10/7) and historically has had issues with transportation.    SW called pt and left a voicemail introducing SW, explaining SW's role, and giving pt SW's contact information.    Aurelia Gras, LCSW, CCM  [phone]

## 2019-12-29 NOTE — Unmapped (Signed)
Hi,     [facility] [facility] Officecontacted the Communication Center regarding the following:    - Corporal Buytas says patient was found deceased and she died from natural causes. He would also like to know if Dr. Rosalia Hammers would sign death certificate.    Please contact Corporal Buytas at [phone].    Thanks in advance,    Laverna Peace  [facility] Cancer Communication Center   [phone]

## 2019-12-29 NOTE — Unmapped (Unsigned)
Breast Oncology Clinic    Referring Physician: Referred Self  No address on file.     PCP: [facility]    Consulting providers:  Surgical Oncology NP: Lowry Bowl, NP  Radiation Oncology: Corinda Gubler, MD    Reason for Visit: management of breast cancer    Assessment/Plan:    [name] is a 74 y.o. woman with history of stroke (2007), history of seizures (2004), 50+ pack-year smoking history with current cigarette use, severe protein-calorie malnutrition, left hip fracture secondary to mechanical fall (2019) and neglected large right breast cancer.    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 has not been seen in my clinic since April 2021 before she was lost to follow up.     On 12/08/19, she presented for restaging imaging (which had been ordered months prior), and was found to have diffusely scattered osseous metastatic lesions.    We again discussed the general principles of treating metastatic breast cancer including goals to control symptoms, control cancer growth, and maintain quality of life, even in the absence of a cure. We discussed that cancer-directed therapy is generally continued indefinitely and that there is seldom a role for local therapies such as surgery or radiation, unless needed to palliate specific symptoms.     She has started letrozole and is tolerating this well so far. We discussed that standard of care for first-line HR+, HER2- MBC is ET plus CDK 4/6 inhibitor. We discussed that adding palbociclib will require more lab monitoring including twice monthly labs for the first two months. One of her previously stated goals is to minimize medical appointments. Her primary goal is to treat the cancer-related pain.  We discussed that starting this medicine and not adhering to recommended lab monitoring would be risky, including her risk of infections related to cytopenias.  For now, will defer initiation of palbociclib, although would reconsider if demonstrates adherence to recommended medical care.    - Letrozole 2.5 mg po daily     Monitoring response to therapy:  - CT CAP and bone scan every 3-4 months (last 11/2019, new baseline; repeat in 02/2020)  - CBC with differential, CMP monthly  - CEA, CA 27-29 every 3-4 months to coincide with scans (if elevated on baseline labs today)    Future therapeutic considerations:  [name] has not had tumor genomic sequencing. Given non-adherence with appointments, will not pursue this right now, as it will not change immediate management. If she has POD in the future, would change to fulvestrant as the next line.    # Osteopenia, osseous metastases  Patient would benefit from bone-directed therapy to decrease risk of pathologic fracture, spinal cord compression, and hypercalcemia of malignancy. Given her nonadherence with recommended appointments, I would favor use of denosumab for ease of administration (rather than Zometa). We have discussed risks of this medication including ONJ. She has poor dentition, and I will refer to Oral Medicine.***  - calcium and vitamin D supplementation  - start denosumab q4w    # Supportive care  - Cancer-related pain in right breast, RUE, and chest wall: Continue APAP, increase to 1000 mg TID; stop ASA; continue tramadol 50 mg po q6h prn. We discussed no early refills, no other  prescribers of controlled substances, importance of securing medications away from others.  Pain control is now improved.  Will continue to assess and escalate as needed. May require oxycodone for adequate pain control until systemic therapy can hopefully improve cancer control and allow healing.  - Chest wall wound: No evidence of infection. Seen by wound care team [facility] [place], [place]) but did not follow up as instructed.   - Psychosocial: Patient feels like she is coping generally well and denies any need for CCSP at this time. Concern for denial regarding diagnosis and comorbid depression as reasons for non-adherence to medical therapies. Will continue to assess and enlist support of family and friend, which she has given [place] permission to do.    # Smoking cessation  - Current smoker. Did not discuss cessation yet due to competing priorities. Discuss at future visit.    # Goals of Care:  - Discussion of an advanced directive: Did not discuss cessation at first visit due to patient's extreme tardiness to appointment and competing priorities. Discuss at future visit. Given incurable cancer and stated preference to minimize medical care and avoid doctors, I recommend ACP including changing code status to DNR. Will assess patient preferences.  - Patient preferences: Control pain, minimize medical visits  - Support systems: Daughter, friend [name] (patient gives [place] permission to discuss medical care with either of these people); Theora Gianotti willing to bring Jodi Contreras to appointments; we can call her if we cannot reach Jodi Contreras by phone.  Daughter was unable to commit to helping bring her mother to appointments and did not elaborate on this.  She was disengaged during the clinic visit today.  - Palliative care referral: Will defer for now and see if we can achieve improved pain control with cancer-directed therapy and initiation of Tramadol. Low threshold to involve PC in near future, especially if visits can be concurrent with med onc appts    # Coordination of care  Patient has had multiple no-shows and extreme tardiness to visit.  She attributes this to lack of transportation.  She has limited support and no one who is clearly available to bring her to appointments.  She relies on public transportation or transportation through her insurance.  She and her daughter request referral to oncologist in [place] to facilitate easier access to care.  We discussed that I would place this referral and I am happy to see her back at any time at the request of the patient or her local oncologist.  ???Referral to medical oncology at [facility]    # Chronic medical conditions, need for primary care  Patient has multiple chronic medical conditions including hypertension which have not been managed over the past few years due to lack of primary care.  She is scheduled to meet a new primary care provider next week.  I encouraged her to attend this appointment.    # Caregiver support:  The patient's friend and daughter are her primary caregivers / support persons. At this time, she is requiring increased assistance with ADLs due to limitations in her RUE. We discussed resources for caregivers at [facility] including CCSP.     # COVID-19 precautions  I recommend that the patient receive the COVID-19 vaccine which is now available to her. We discussed that the vaccine is safe and effective. She is at increased risk of hospitalization or death due to COVID based on her age, smoking history, and advanced cancer. Her friend Theora Gianotti will help her schedule this.  Follow up:   As needed    This note with created with dictation software. Please forgive any transcription errors and notify writer if changes are required.     I personally spent 50 minutes face-to-face and non-face-to-face in the care of this patient, which includes all pre, intra, and post visit time on the date of service.     --------------------------    Interval History:  [name] is a 74 y.o. woman with history of stroke (2007), history of seizures (2004), 50+ pack-year smoking history with current cigarette use, severe protein-calorie malnutrition, left hip fracture secondary to mechanical fall (2019), and large right neglected breast cancer who presents in follow up for management of now metastatic breast cancer after a 5-year hiatus from oncologic care.    [name] was last seen in my clinic on 06/25/19. Since that time, she has started letrozole.  She reports good adherence to the drug and denies problems with hot flashes, vaginal dryness, or arthralgias.    At the last visit, we also started tramadol 50 mg every 6 hours as needed.  She is taking this a few times a day, and feels like her pain is under much better control.  It is not completely gone but it is interfering less with her day-to-day function.    She has not noticed any changes on her chest.  She continues to have a weeping, large wound.  She is performing dressing changes at home and has an upcoming appointment at [facility] for wound care consultation.    She feels like she has no energy.  She eats small meals several times a day but continues to lose weight.    She is not really sure what her goal is in her treatment.  She previously indicated that it was to reduce pain.    She missed her appointment yesterday for a bone scan and CT scan.  She also missed her laboratory appointments.  She indicates that she had requested transportation through her insurance provider but they did not show up.  Her daughter was unaware of these appointments.  Today, we discussed the importance of coming to scheduled visit so that we can provide the best possible care.  Her daughter is unable to commit to bring her to appointments and does not elaborate on this.  [name] and her daughter feel that referral to an oncologist closer to home would enable them to better engage with her care.    I have reviewed her records including history, imaging, pathology reports, and, when applicable, operative notes and updated her oncologic history below:    Oncology History Overview Note   15 year history of R breast mass that has now become erosive    Breast Imaging:  03/23/2014  Targeted ultrasound of the right central breast demonstrates a corresponding 4.1 x 1.5 cm mass with irregular shape, not parallel orientation, and not circumscribed margins.     Malignant neoplasm of overlapping sites of right female breast (CMS-HCC)   05/23/2013 Biopsy    A: Breast, right central, core needle biopsy   - Invasive lobular carcinoma, classic and pleomorphic types (see comment)   - Nottingham combined histologic grade: 2    Estrogen receptor Positive (100%, 3+), Progesterone receptor Positive (100%, 3+), HER2 negative    B: Upper outer quadrant/axillary mass, right, core needle biopsy   - Lymph node with metastatic breast carcinoma (see comment)     C: Upper outer quadrant/axillary mass, left, core needle  biopsy   - Lymph node with metastatic breast carcinoma (see comment)  - Receptor immunohistochemistry:    Comment: The tumor in all three parts is morphologically similar -- invasive carcinoma arranged in nests and with grade 2 nuclei transitions into invasive carcinoma arranged singly and in single file with grade 3 nuclei.     04/07/2014 Initial Diagnosis    Malignant neoplasm of overlapping sites of right female breast (RAF-HCC)     04/2014 Interval Scan(s)    No evidence of distant disease     04/28/2014 -  Cancer Staged    Staging form: Breast, AJCC 7th Edition  - Clinical stage from 04/28/2014: Stage IIIB (T4b, N1, M0) - Signed by Gwinda Passe, MD on 06/23/2019       04/28/2014 Endocrine/Hormone Therapy    Primary endocrine therapy started  Continued for about 1 year until prescription ran out  Patient did not return to oncology clinic after initial consultation     01/12/2018 Interval Scan(s)    CT head at [facility]  Sclerotic lesion in skull c/f metastatic disease     06/26/2019 Progression    Patient presents back to oncology clinic after 5 year hiatus  Worsening right breast and chest wall pain  Large, necrotic chest wall lesion and auto-mastectomy of right breast     06/26/2019 Endocrine/Hormone Therapy    Letrozole restarted     06/2019 - 12/2019 Other    Lost to follow up     11/2019 Interval Scan(s)    Restaging scans with diffuse osseous lesions, no other sites of metastatic disease (no prior baseline since 2016 due to patient non-compliance, so unable to gauge response to letrozole)         Past Medical History:   Diagnosis Date   ??? Depression    ??? Malignant neoplasm of overlapping sites of right female breast (CMS-HCC) 04/07/2014   ??? Pulmonary emphysema (CMS-HCC) 01/25/2020   ??? Seizures (CMS-HCC)    ??? Stroke (CMS-HCC)        Gyn History: postmenopausal    Social History     Tobacco Use   ??? Smoking status: Current Every Day Smoker     Packs/day: 0.50     Years: 54.00     Pack years: 27.00   ??? Smokeless tobacco: Never Used   Substance Use Topics   ??? Alcohol use: No   ??? Drug use: No       Social History     Social History Narrative    Jodi Contreras lives in [place] by herself. She is not working. She enjoys reading mysteries. She has a daughter in [place] and a son who lives out of state. She has grandchildren whom she sees a few times a month. Ms. [name] is a good friend who looks out for her.       Review of Systems: A 12-system review of systems was obtained including: Constitutional, Eyes, ENT, Cardiovascular, Respiratory, GI, GU, Musculoskeletal, Skin, Neurological, Psychiatric, Endocrine, Heme/Lymphatic, and Allergic/Immunologic systems. It is negative or non-contributory to the patient???s management except for the following: depression, anxiety, weight loss.        ECOG Performance Status: 2 - Ambulatory and capable of all selfcare but unable to carry out any work activities. Up and about more than 50% of waking hours (limited by decreased ROM in RUE)        Physical Examination:  Vital Signs: There were no vitals taken for this visit.  CONSTITUTIONAL: Pleasant,  thin-appearing woman in NAD  HEENT: Pupils equally round, sclerae anicteric, conjunctiva clear, moist mucous membranes, OP exam deferred  HEME/LYMPH: No palpable cervical or supraclavicular lymphadenopathy; right axillary fullness  EXT: Digits and nails examined, no joint hypertrophy or nail changes  GU/BREAST: On inspection, there is auto mastectomy of the right breast with erosive right nipple, central chest necrosis spanning 10 x 10 cm with serosanguineous drainage, no purulence or malodor; left nipple thickening without palpable mass (photo below)  MSK: Right arm abduction limited to 90 degrees  NEURO: Alert and oriented, speech intact, following commands, moving all extremities well, no focal deficits appreciated  PSYCH: Normal mood and appropriate affect    Central/right chest wall, 07/24/19:          I have personally reviewed the breast imaging, laboratory values, existing medical records, and pathology. I have summarized these findings and my interpretation in the oncology history and assessment above.

## 2019-12-29 NOTE — Unmapped (Incomplete)
I recommend that you receive the COVID vaccine if you have not yet received it.    For more information regarding receiving the COVID-19 vaccine    --at [facility], go to:  [email or web address]  or call [phone] (M-F, 8 am-5pm)    --at other locations across the state, go to:  [email or web address]  ------------------------    For appointments & urgent questions Monday through Friday 8 AM???5 PM:  Please call [phone]    Your medical oncologist is: Dr. Wendall Papa  Your medical oncology nurse navigator is: Dan Maker, RN  Your clinical pharmacist is: Dr. Raelene Bott    For urgent clinical needs on Nights, Weekends or Holidays:  Call [phone] and ask for the oncologist on call.   For appointment changes, please contact us during normal business hours, as we do not have staff to assist with scheduling after hours.     You can also message your team using [email or web address]. This is for non-urgent messages only. It may take 24-48 hours for your team to answer you.     For complex questions or concerns, we will recommend a clinic visit, as complex medical decision-making should not be made via MyChart. For non-urgent questions, please consider waiting until your next clinic visit to discuss with your medical team when a thorough assessment can be performed and treatment options can be discussed.    ---------------------  Please visit [email or web address], a resource created just for family members and caregivers.  This website lists support services, how and where to ask for help. It has tools to assist you as you help [place] care for your loved one.      [facility]  [address]  [place], [place] [address]  [email or web address]

## 2019-12-30 NOTE — Unmapped (Signed)
Hi,     April with [employer] contacted the Communication Center regarding the following:    - Calling to inform care team that the funeral home has switched over to electronic software. Wants to know if Dr. Rosalia Hammers is registered with [facility] or is she still accepting paper copies?     Please contact April at [phone].    Thanks in advance,    Jannette Spanner  [facility]   [phone]

## 2019-12-30 NOTE — Unmapped (Signed)
Call returned to April (wrong phone number provided) at [facility]. She inquires if we are using the system she uses electronically for physicians to sign death certificates. Informed her that this RN does not believe we do. Provided fax number to April.     April would like to bring paper copy to clinic tomorrow for Dr. Rosalia Hammers to sign. Discussed where personnel should bring this- 2nd floor of the [facility], Hematology Oncology desk. She verbalized understanding.

## 2020-01-04 NOTE — Unmapped (Signed)
Hi,     April with [employer] contacted the Communication Center regarding the following:    -April called again requesting that she is still waiting on signed death certificates for patient.    Please contact patient at [phone].    Thanks in advance,    Christell Faith  [facility] Communication Center   [phone]

## 2020-01-04 NOTE — Unmapped (Signed)
Call returned to April to follow up on death certificate.    Inquired if April/funeral home had brought the death certificate, as discussed, to [facility] on Thursday, as this RN had not seen it yet. April states that no, they were unable to bring it on Thursday. She is inquiring when she could bring it by this week.     Informed April that Dr. Rosalia Hammers was [place] this week and would be returning to campus next week and could sign it then. April states that another physician would need to sign this in her absence, as that is too long to wait. Informed April that this RN would be in clinic on Wednesday with the covering attending, if they wished to bring it at that time. Provided office and clinic name. April to have it brought on Wednesday.

## 2020-01-06 NOTE — Unmapped (Signed)
[employer] representative came to [facility] clinic to obtain signature for death certificate. Dr. Laney Pastor signed the death certificate in Dr. Denny Levy absence. Death certificate given back to [facility] representative.

## 2020-01-25 DEATH — deceased

## 2020-02-12 IMAGING — CT CT HEAD W/O CM
4 series · 16 of 47 positions shown, 18 images · non-contrast
Comparison: None.

CLINICAL DATA: Fall with head trauma.  Initial encounter.

EXAM:
CT HEAD WITHOUT CONTRAST
TECHNIQUE: Contiguous axial images were obtained from the base of the skull
through the vertex without intravenous contrast.

[Series 2: head wo · axial · 0.39mm/px · z∈[+235,+335]mm · 7 of 28 slices shown, 9 images]
[im 4/28  brain]
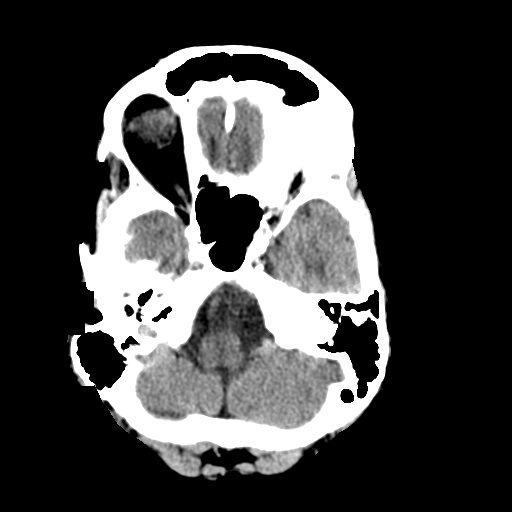
[im 4/28  bone]
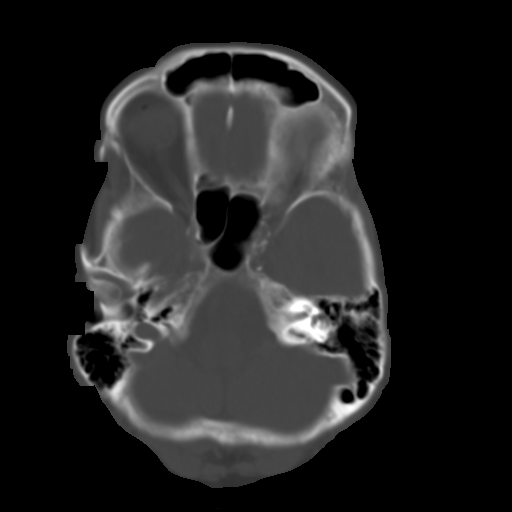
[im 7/28  brain]
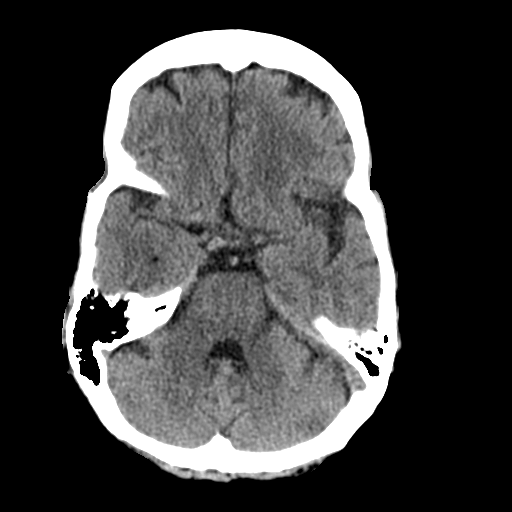
[im 11/28  brain]
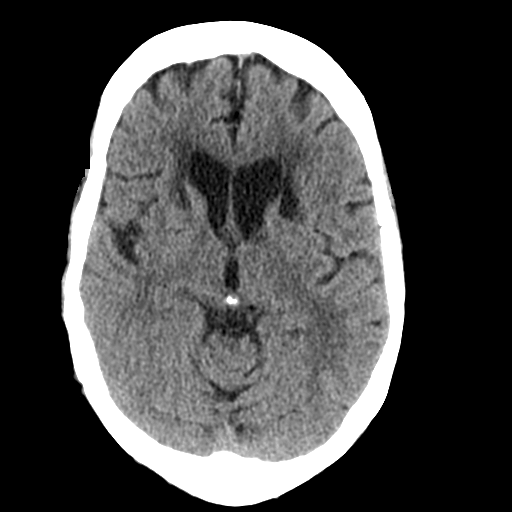
[im 14/28  brain]
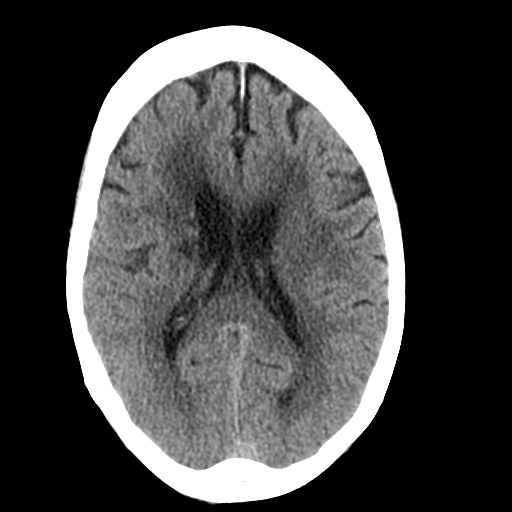
[im 17/28  brain]
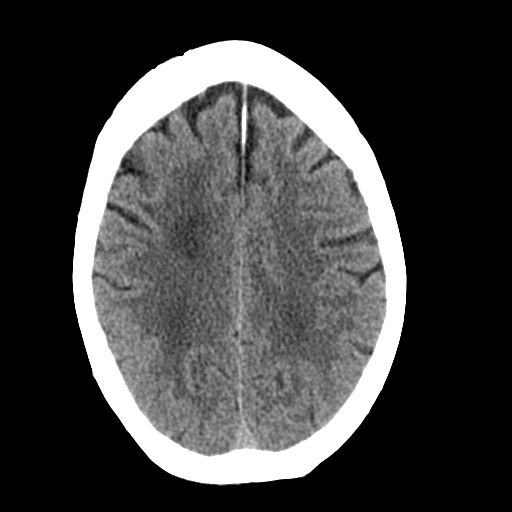
[im 17/28  bone]
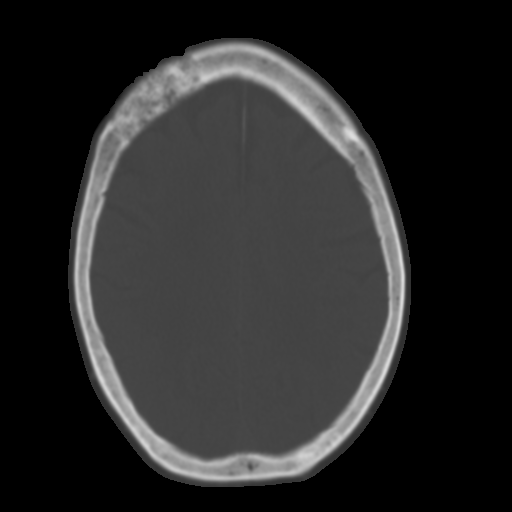
[im 21/28  brain]
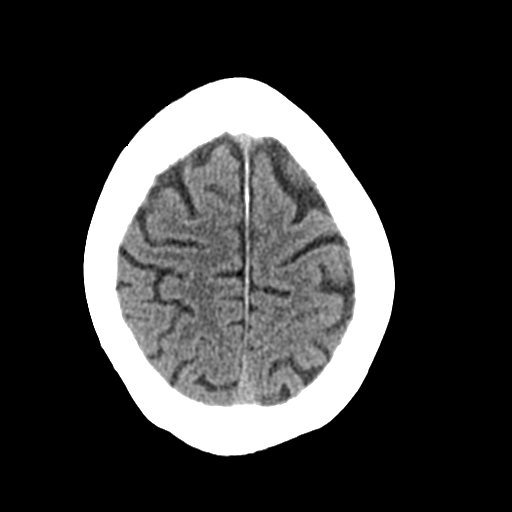
[im 24/28  brain]
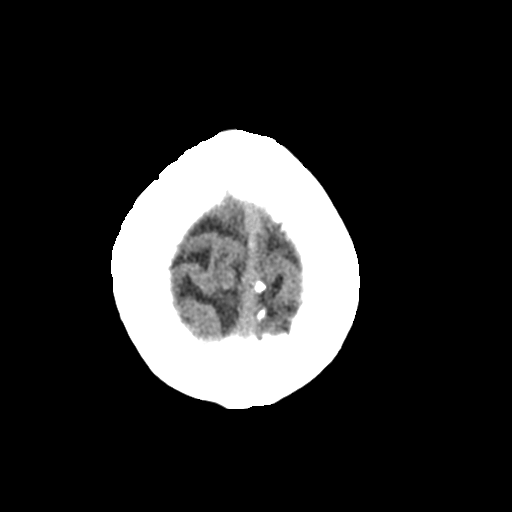

[Series 3: head bone · axial · 0.39mm/px · z∈[+232,+260]mm · 3 of 70 slices shown]
[im 7/70  bone]
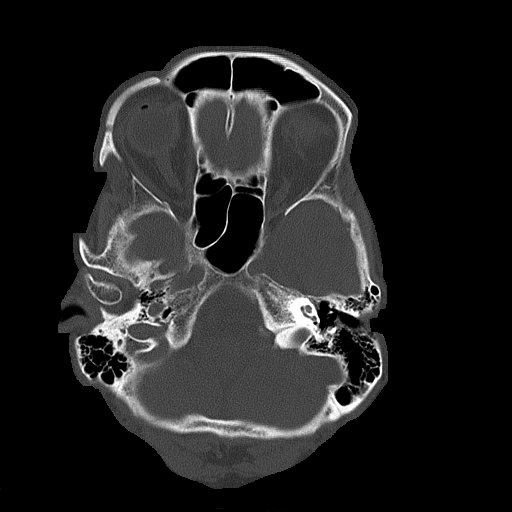
[im 14/70  bone]
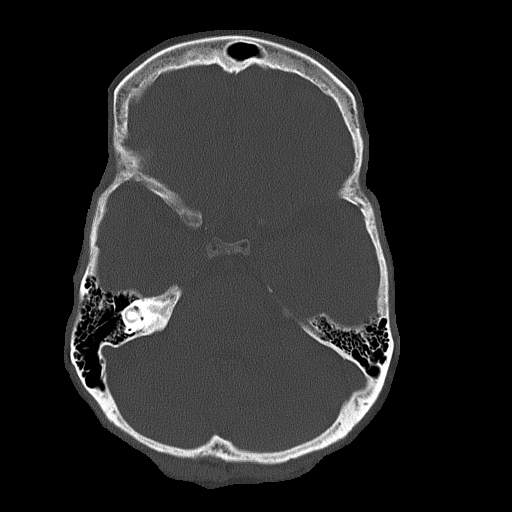
[im 21/70  bone]
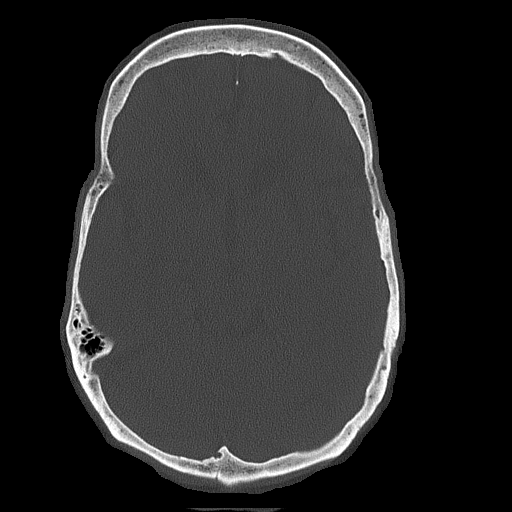

[Series 4: coronal soft tissue · coronal · 0.27mm/px · 3 of 64 slices shown]
[im 22/64  brain]
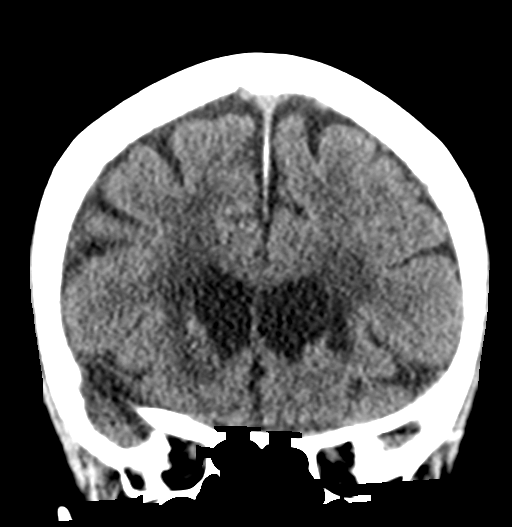
[im 29/64  brain]
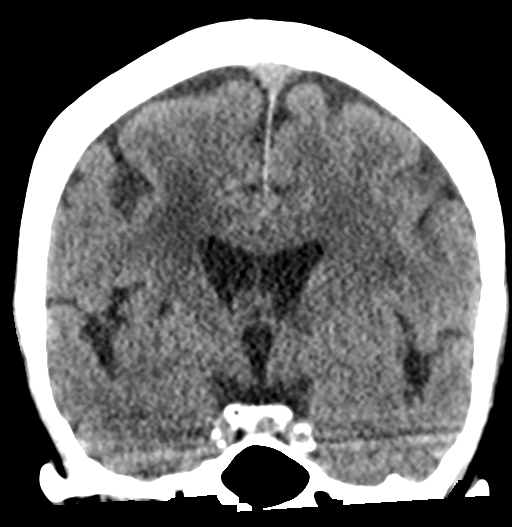
[im 36/64  brain]
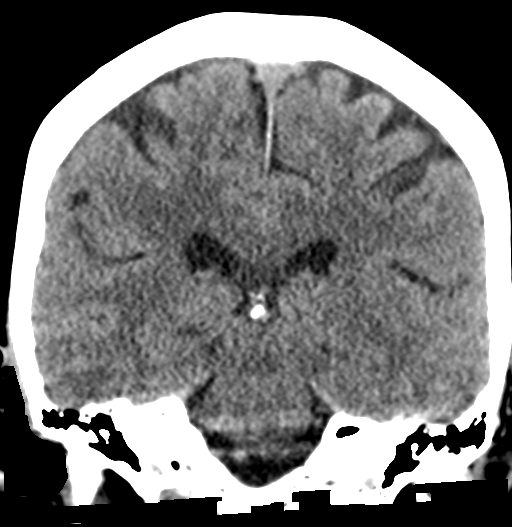

[Series 5: sagittal soft tissue · sagittal · 0.27mm/px · 3 of 46 slices shown]
[im 16/46  brain]
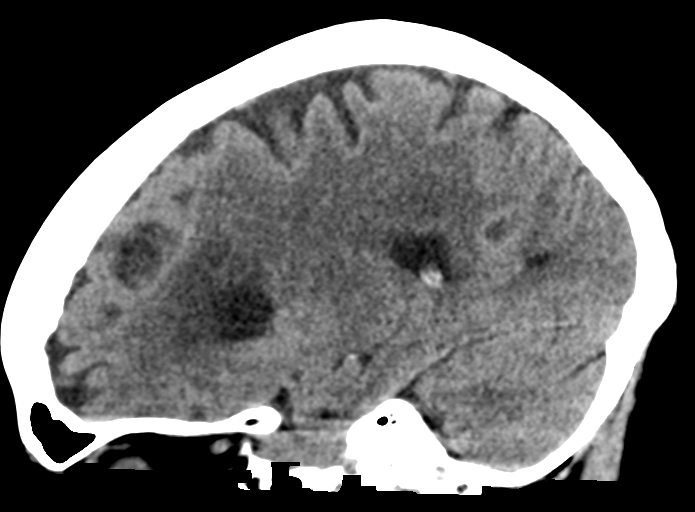
[im 23/46  brain]
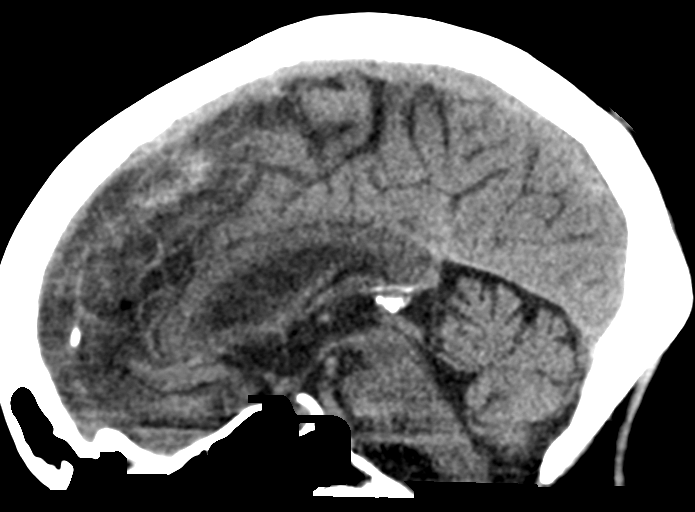
[im 31/46  brain]
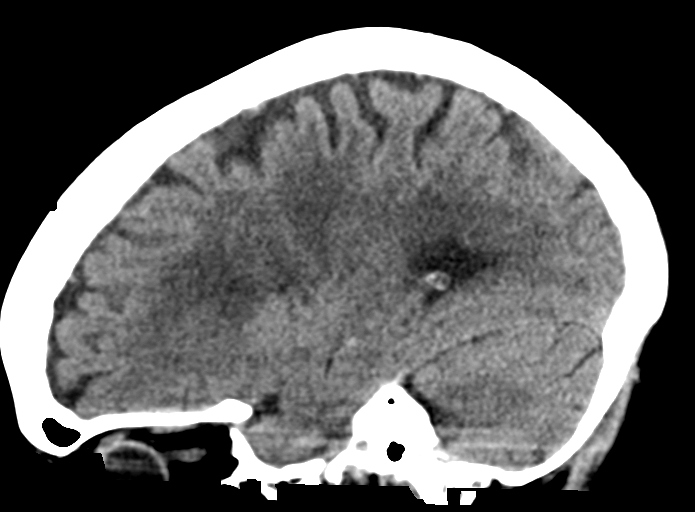

[16 of 47 positions shown; findings below may reference images not displayed]

FINDINGS: Brain: No evidence of acute infarction, hemorrhage, hydrocephalus,
extra-axial collection or mass lesion/mass effect. Remote perforator
infarcts in the bilateral basal ganglia and right corona radiata.
Ischemic gliosis in the deep cerebral white matter.

Vascular: Atherosclerotic calcification

Skull: History of breast cancer. There is an irregular sclerotic and
mildly expansile lesion in the right frontal calvarium with
indistinct zone of margination measuring 4.1 cm in diameter.

Sinuses/Orbits: Negative
IMPRESSION: 1. No acute finding.
2. Large sclerotic lesion in the right frontal bone suspicious for
metastatic deposit in this patient with history of breast cancer.
Please correlate with prior imaging. Bone scan could be obtained on
an elective basis.
3. Chronic small vessel ischemia with remote perforator infarcts.

## 2020-02-12 IMAGING — CR DG CHEST 2V
2 series · 2 of 2 positions shown · non-contrast
Comparison: One-view chest x-ray 01/10/2018

CLINICAL DATA: Status post ORIF. Fever and cough. Smoking history.

EXAM:
CHEST - 2 VIEW

[chest lat]
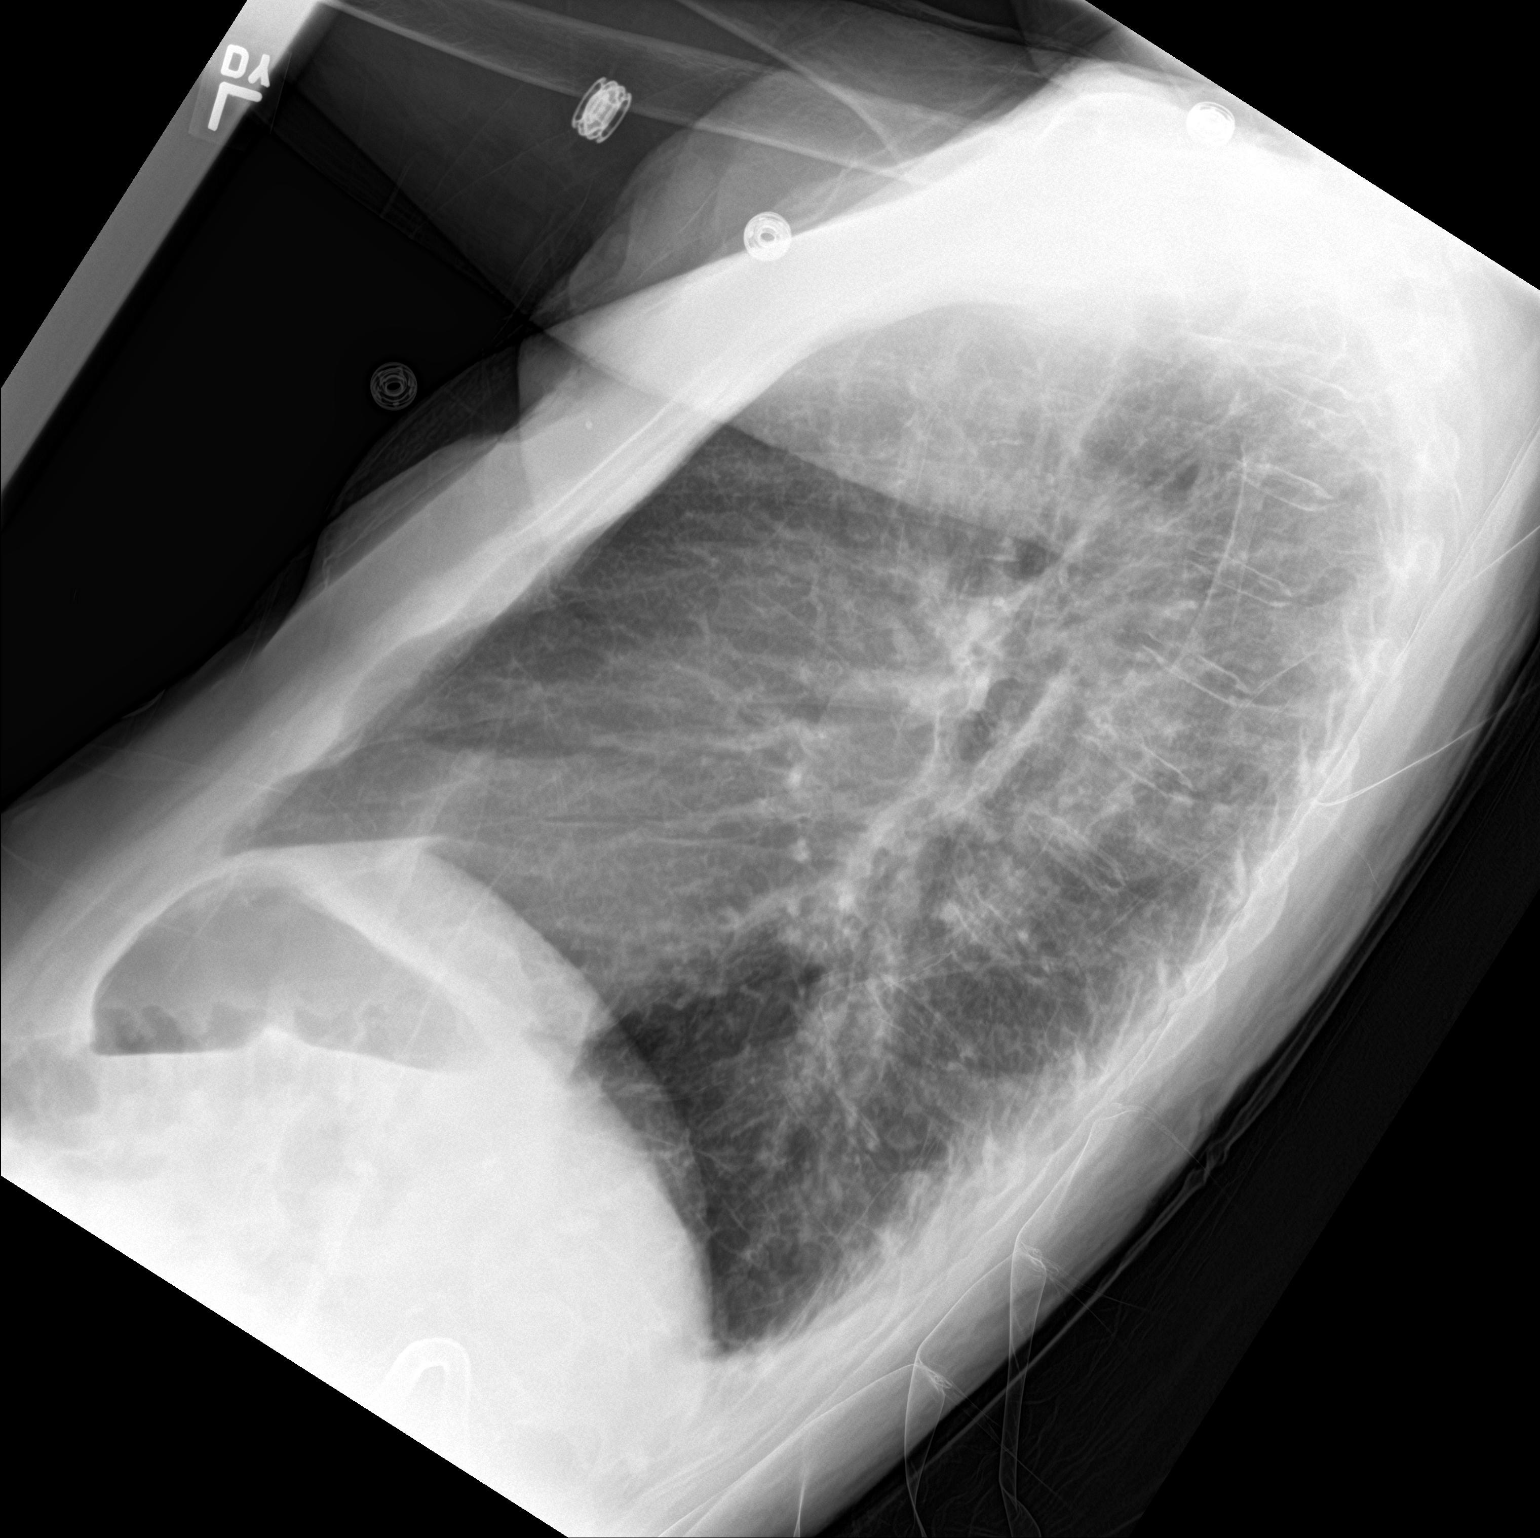

[chest ap]
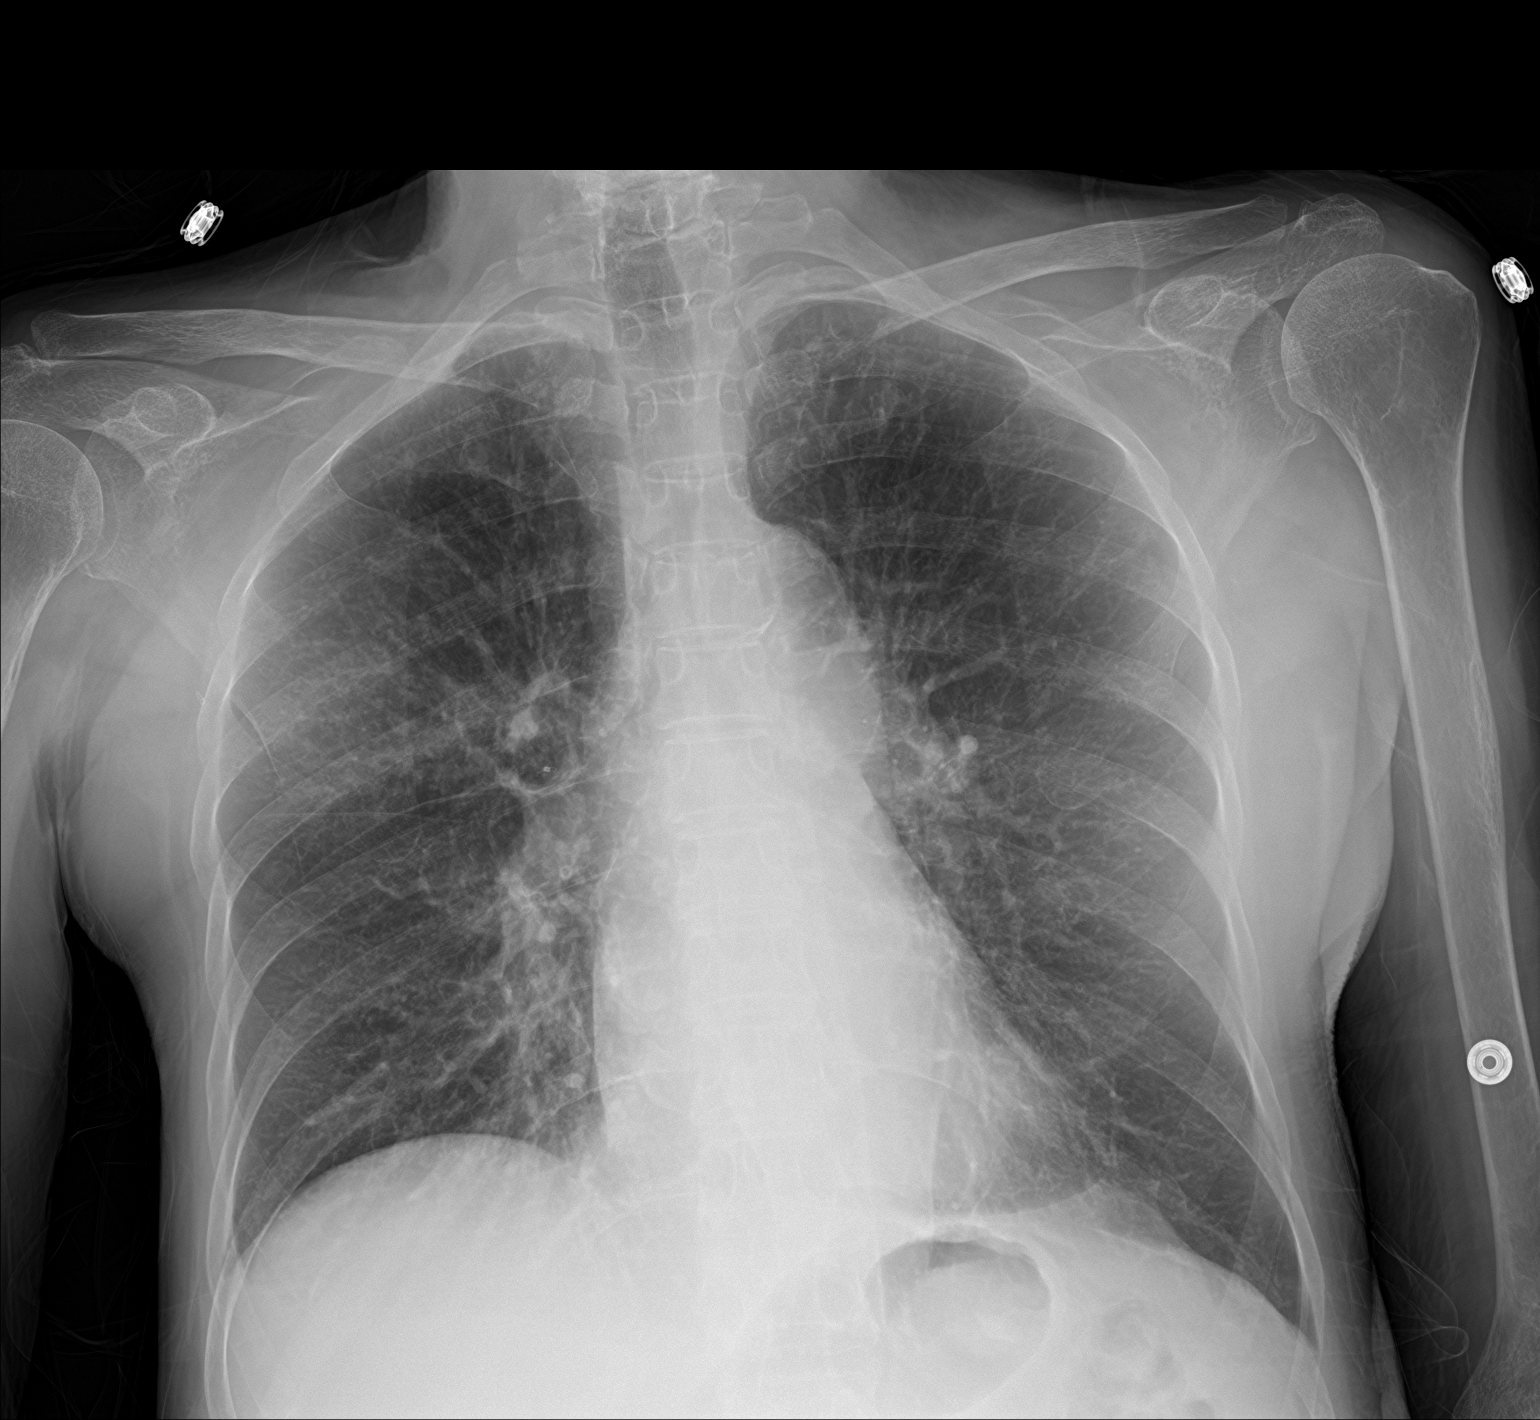

[2 of 2 positions shown; findings below may reference images not displayed]

FINDINGS: Heart size normal. Atherosclerotic changes are present at the aorta.
Changes of COPD are again noted. There is subtle increased airspace
disease in the right upper lobe. Early infection is not excluded.
IMPRESSION: 1. Subtle right upper lobe airspace disease raises possibility of
early pneumonia.
2. Changes of COPD without other focal airspace disease.

## 2021-07-17 IMAGING — CR DG CHEST 2V
1 series · 2 of 2 positions shown · non-contrast
Comparison: Chest radiograph 01/12/2018

CLINICAL DATA: Wound on chest. Additional history provided: History
of breast cancer approximately 6 years ago, deep open wound to
center of chest extending to right breast, serosanguineous drainage

EXAM:
CHEST - 2 VIEW

[Series 1: dg chest 2 view · 0.14mm/px · 2 of 2 slices shown]
[im 1/2]
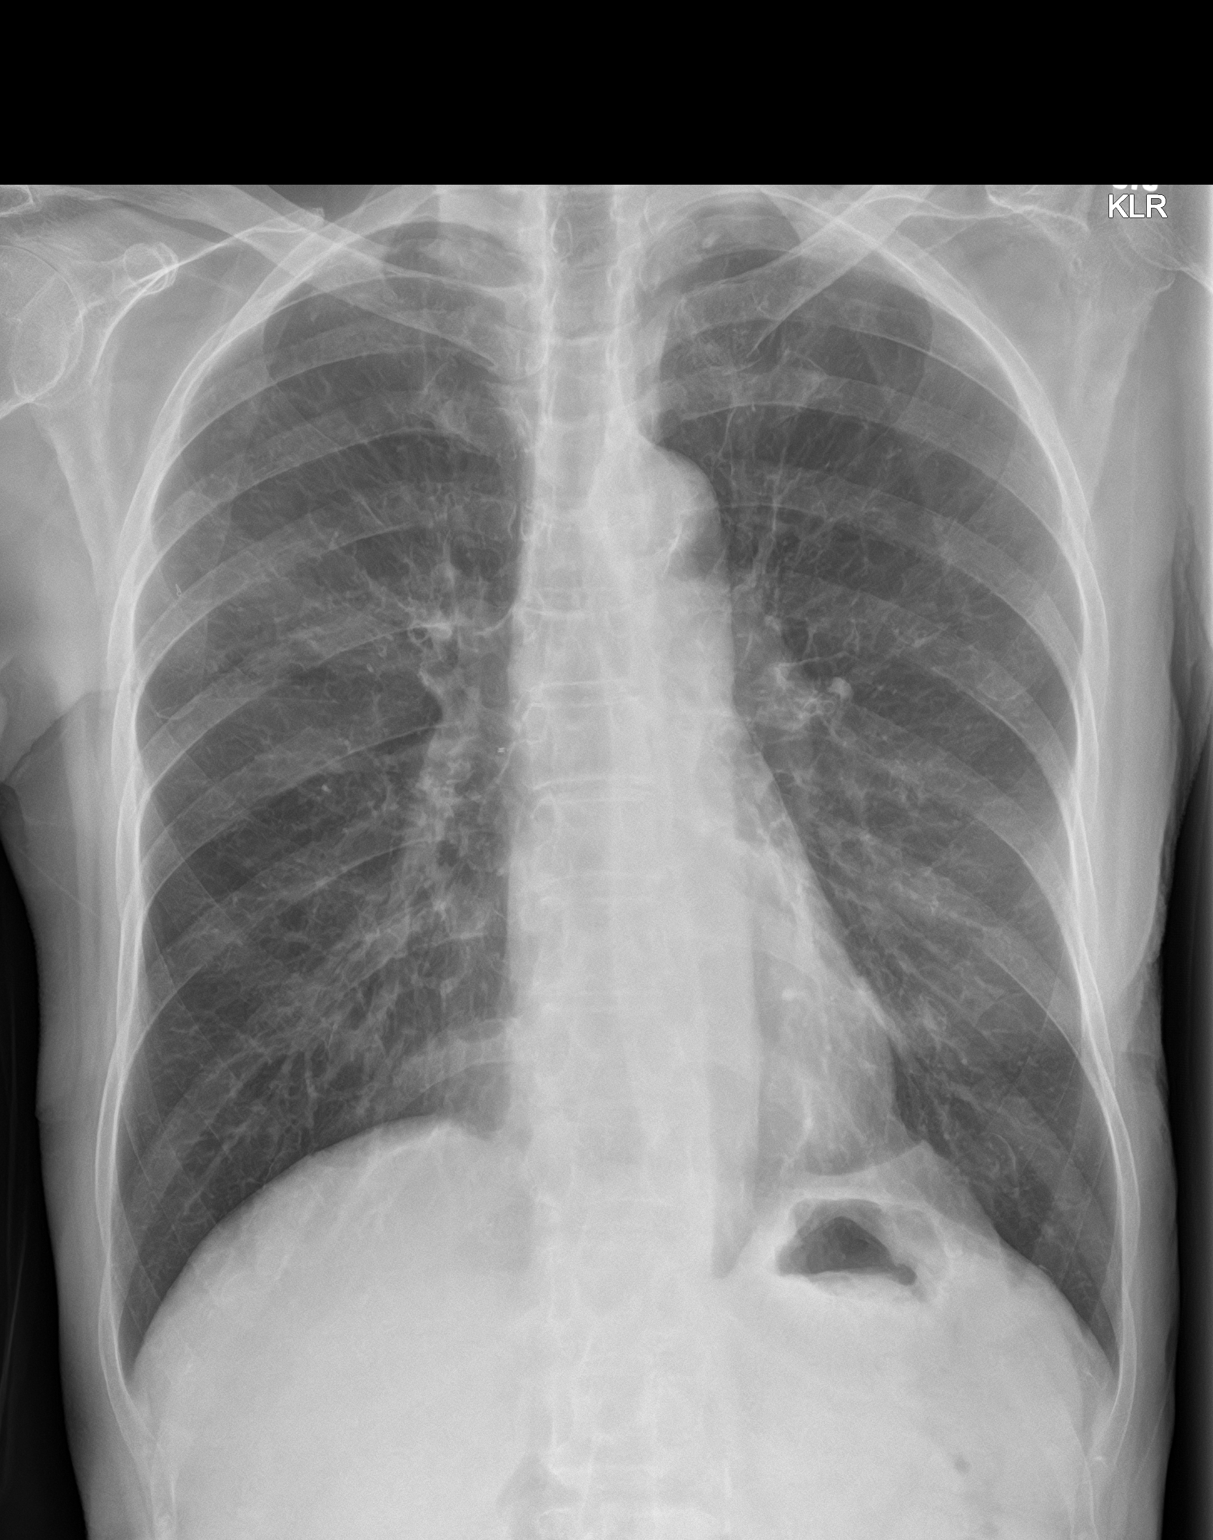
[im 2/2]
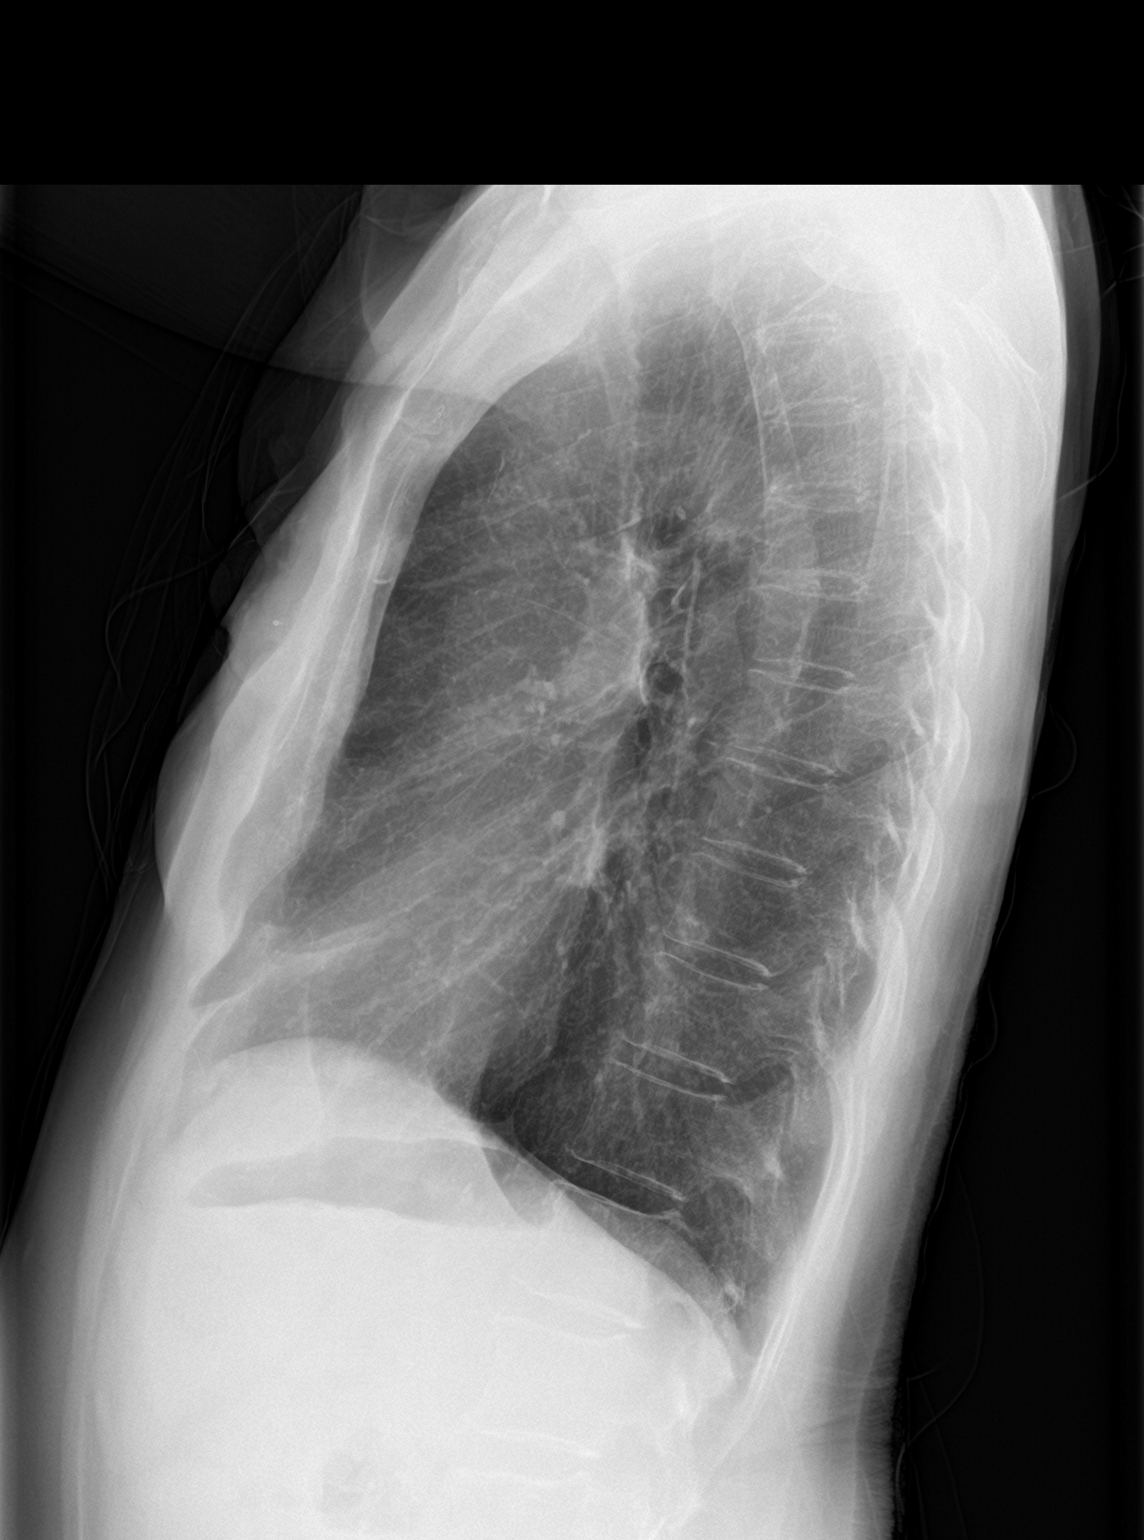

[2 of 2 positions shown; findings below may reference images not displayed]

FINDINGS: Heart size within normal limits. Aortic atherosclerosis.
Redemonstrated changes of COPD. No evidence of airspace
consolidation within the lungs. No evidence of pleural effusion or
pneumothorax. No acute bony abnormality. Apparent anterior chest
soft tissue defect appreciated on the lateral view radiograph
consistent with the provided history.
IMPRESSION: No evidence of acute cardiopulmonary abnormality.

Chronic changes of COPD.  Aortic atherosclerosis.

Anterior chest soft tissue defect appreciated on lateral view
radiograph, consistent with the provided history.
